# Patient Record
Sex: Male | Born: 1985 | Race: Black or African American | Hispanic: No | Marital: Single | Smoking: Never smoker
Health system: Southern US, Community
[De-identification: ages and names within clinical notes are randomized; demographics above are authoritative.]

---

## 2005-03-16 ENCOUNTER — Emergency Department (HOSPITAL_COMMUNITY): Admission: EM | Admit: 2005-03-16 | Discharge: 2005-03-17 | Payer: Self-pay | Admitting: Emergency Medicine

## 2005-09-10 ENCOUNTER — Encounter: Admission: RE | Admit: 2005-09-10 | Discharge: 2005-09-10 | Payer: Self-pay | Admitting: Family Medicine
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
## 2009-10-19 ENCOUNTER — Emergency Department (HOSPITAL_COMMUNITY): Admission: EM | Admit: 2009-10-19 | Discharge: 2009-10-19 | Payer: Self-pay | Admitting: Family Medicine

## 2009-11-07 ENCOUNTER — Emergency Department (HOSPITAL_COMMUNITY): Admission: EM | Admit: 2009-11-07 | Discharge: 2009-11-07 | Payer: Self-pay | Admitting: Family Medicine

## 2010-08-31 ENCOUNTER — Inpatient Hospital Stay (INDEPENDENT_AMBULATORY_CARE_PROVIDER_SITE_OTHER)
Admission: RE | Admit: 2010-08-31 | Discharge: 2010-08-31 | Disposition: A | Discharge status: Home / Self Care | Payer: BC Managed Care – PPO | Source: Ambulatory Visit | Attending: Family Medicine | Admitting: Family Medicine

## 2010-08-31 DIAGNOSIS — M549 Dorsalgia, unspecified: Secondary | ICD-10-CM

## 2010-09-21 ENCOUNTER — Inpatient Hospital Stay (INDEPENDENT_AMBULATORY_CARE_PROVIDER_SITE_OTHER)
Admission: RE | Admit: 2010-09-21 | Discharge: 2010-09-21 | Disposition: A | Discharge status: Home / Self Care | Payer: BC Managed Care – PPO | Source: Ambulatory Visit | Attending: Family Medicine | Admitting: Family Medicine

## 2010-09-21 DIAGNOSIS — IMO0002 Reserved for concepts with insufficient information to code with codable children: Secondary | ICD-10-CM

## 2013-09-06 ENCOUNTER — Emergency Department (INDEPENDENT_AMBULATORY_CARE_PROVIDER_SITE_OTHER)
Admission: EM | Admit: 2013-09-06 | Discharge: 2013-09-06 | Disposition: A | Discharge status: Home / Self Care | Payer: Self-pay | Source: Home / Self Care | Attending: Family Medicine | Admitting: Family Medicine

## 2013-09-06 ENCOUNTER — Encounter (HOSPITAL_COMMUNITY): Payer: Self-pay | Admitting: Emergency Medicine

## 2013-09-06 DIAGNOSIS — M545 Low back pain, unspecified: Secondary | ICD-10-CM

## 2013-09-06 MED ORDER — TRAMADOL HCL 50 MG PO TABS: 50.0000 mg | ORAL_TABLET | Freq: Four times a day (QID) | ORAL | Status: DC | PRN

## 2013-09-06 MED ORDER — DICLOFENAC SODIUM 75 MG PO TBEC
75.0000 mg | DELAYED_RELEASE_TABLET | Freq: Two times a day (BID) | ORAL | Status: DC | PRN
Start: 2013-09-06 — End: 2013-09-11

## 2013-09-06 MED ORDER — METHYLPREDNISOLONE ACETATE 80 MG/ML IJ SUSP
80.0000 mg | Freq: Once | INTRAMUSCULAR | Status: AC
Start: 2013-09-06 — End: 2013-09-06
  Administered 2013-09-06: 80 mg via INTRAMUSCULAR

## 2013-09-06 MED ORDER — CYCLOBENZAPRINE HCL 10 MG PO TABS: 10.0000 mg | ORAL_TABLET | Freq: Every evening | ORAL | Status: DC | PRN

## 2013-09-06 MED ORDER — METHYLPREDNISOLONE ACETATE 80 MG/ML IJ SUSP
INTRAMUSCULAR | Status: AC
  Filled 2013-09-06: qty 1

## 2013-09-06 MED ORDER — KETOROLAC TROMETHAMINE 60 MG/2ML IM SOLN
INTRAMUSCULAR | Status: AC
  Filled 2013-09-06: qty 2

## 2013-09-06 MED ORDER — KETOROLAC TROMETHAMINE 60 MG/2ML IM SOLN
60.0000 mg | Freq: Once | INTRAMUSCULAR | Status: AC
  Administered 2013-09-06: 60 mg via INTRAMUSCULAR

## 2013-09-06 NOTE — Discharge Instructions (Signed)
Thank you for coming in today. Take diclofenac twice daily started this evening. This medication we'll not make you sleepy.  Use Flexeril at bedtime as needed for muscle thousand. This medication will make you sleepy. Use tramadol for severe pain. Do not drive after taking this medication. Come back or go to the emergency room if you notice new weakness new numbness problems walking or bowel or bladder problems. Use a heating pad.  Back Exercises Back exercises help treat and prevent back injuries. The goal is to increase your strength in your belly (abdominal) and back muscles. These exercises can also help with flexibility. Start these exercises when told by your doctor. HOME CARE Back exercises include: Pelvic Tilt.  Lie on your back with your knees bent. Tilt your pelvis until the lower part of your back is against the floor. Hold this position 5 to 10 sec. Repeat this exercise 5 to 10 times. Knee to Chest.  Pull 1 knee up against your chest and hold for 20 to 30 seconds. Repeat this with the other knee. This may be done with the other leg straight or bent, whichever feels better. Then, pull both knees up against your chest. Sit-Ups or Curl-Ups.  Bend your knees 90 degrees. Start with tilting your pelvis, and do a partial, slow sit-up. Only lift your upper half 30 to 45 degrees off the floor. Take at least 2 to 3 seonds for each sit-up. Do not do sit-ups with your knees out straight. If partial sit-ups are difficult, simply do the above but with only tightening your belly (abdominal) muscles and holding it as told. Hip-Lift.  Lie on your back with your knees flexed 90 degrees. Push down with your feet and shoulders as you raise your hips 2 inches off the floor. Hold for 10 seconds, repeat 5 to 10 times. Back Arches.  Lie on your stomach. Prop yourself up on bent elbows. Slowly press on your hands, causing an arch in your low back. Repeat 3 to 5 times. Shoulder-Lifts.  Lie face down  with arms beside your body. Keep hips and belly pressed to floor as you slowly lift your head and shoulders off the floor. Do not overdo your exercises. Be careful in the beginning. Exercises may cause you some mild back discomfort. If the pain lasts for more than 15 minutes, stop the exercises until you see your doctor. Improvement with exercise for back problems is slow.  Document Released: 07/27/2010 Document Revised: 09/16/2011 Document Reviewed: 04/25/2011 [facility] Patient Information 2014 [place]Care, [place].

## 2013-09-06 NOTE — ED Provider Notes (Signed)
Jim PanningJerome Norris is a 28 y.o. male who presents to Urgent Care today for low back pain. Patient notes acute onset of low back pain starting today. He had shoveled snow the past few days and was changing a tire on his car today when he felt a pull in sensation in his low back. He has had moderate to severe low back pain since. He denies any radiating pain weakness numbness bowel bladder dysfunction or difficulty walking. He tried Tylenol which has not helped. He feels well otherwise. He denies any injury.   No past medical history on file. History  Substance Use Topics  . Smoking status: Not on file  . Smokeless tobacco: Not on file  . Alcohol Use: Not on file   ROS as above Medications: Current Facility-Administered Medications  Medication Dose Route Frequency Provider Last Rate Last Dose  . ketorolac (TORADOL) injection 60 mg  60 mg Intramuscular Once Rodolph BongEvan S Rigdon Macomber, MD      . methylPREDNISolone acetate (DEPO-MEDROL) injection 80 mg  80 mg Intramuscular Once Rodolph BongEvan S Saamir Armstrong, MD       Current Outpatient Prescriptions  Medication Sig Dispense Refill  . cyclobenzaprine (FLEXERIL) 10 MG tablet Take 1 tablet (10 mg total) by mouth at bedtime as needed for muscle spasms.  20 tablet  0  . diclofenac (VOLTAREN) 75 MG EC tablet Take 1 tablet (75 mg total) by mouth 2 (two) times daily as needed.  60 tablet  0  . traMADol (ULTRAM) 50 MG tablet Take 1 tablet (50 mg total) by mouth every 6 (six) hours as needed.  15 tablet  0    Exam:  BP 168/93  Pulse 89  Temp(Src) 98.5 F (36.9 C) (Oral)  Resp 16  SpO2 100% Gen: Well NAD Back: Nontender to spinal midline. Decreased flexion and extension due to pain. Tender palpation bilateral lumbar paraspinals. Strength is intact throughout. Reflexes are diminished but equal bilateral lower extremities. Normal gait. Sensation intact throughout.  Patient was given IM Toradol and Depo-Medrol prior to discharge.    Assessment and Plan: 28 y.o. male with  myofascial disruption resulting in lumbago.  Plan to treat with NSAIDs, muscle relaxers, heating pad, and tramadol.  Discussed warning signs or symptoms. Please see discharge instructions. Patient expresses understanding.    Rodolph BongEvan S Haniyyah Sakuma, MD 09/06/13 [phone]

## 2013-09-06 NOTE — ED Notes (Signed)
No known injury. Eval by MD only

## 2013-09-11 ENCOUNTER — Emergency Department (HOSPITAL_COMMUNITY)
Admission: EM | Admit: 2013-09-11 | Discharge: 2013-09-11 | Disposition: A | Discharge status: Home / Self Care | Payer: Self-pay | Attending: Emergency Medicine | Admitting: Emergency Medicine

## 2013-09-11 ENCOUNTER — Encounter (HOSPITAL_COMMUNITY): Payer: Self-pay | Admitting: Emergency Medicine

## 2013-09-11 DIAGNOSIS — R197 Diarrhea, unspecified: Secondary | ICD-10-CM | POA: Insufficient documentation

## 2013-09-11 DIAGNOSIS — IMO0002 Reserved for concepts with insufficient information to code with codable children: Secondary | ICD-10-CM | POA: Insufficient documentation

## 2013-09-11 DIAGNOSIS — F17200 Nicotine dependence, unspecified, uncomplicated: Secondary | ICD-10-CM | POA: Insufficient documentation

## 2013-09-11 DIAGNOSIS — R112 Nausea with vomiting, unspecified: Secondary | ICD-10-CM | POA: Insufficient documentation

## 2013-09-11 DIAGNOSIS — Y9289 Other specified places as the place of occurrence of the external cause: Secondary | ICD-10-CM | POA: Insufficient documentation

## 2013-09-11 DIAGNOSIS — X58XXXA Exposure to other specified factors, initial encounter: Secondary | ICD-10-CM | POA: Insufficient documentation

## 2013-09-11 DIAGNOSIS — Y93H1 Activity, digging, shoveling and raking: Secondary | ICD-10-CM | POA: Insufficient documentation

## 2013-09-11 DIAGNOSIS — M549 Dorsalgia, unspecified: Secondary | ICD-10-CM

## 2013-09-11 MED ORDER — ONDANSETRON 4 MG PO TBDP
8.0000 mg | ORAL_TABLET | Freq: Once | ORAL | Status: AC
  Administered 2013-09-11: 8 mg via ORAL
  Filled 2013-09-11: qty 2

## 2013-09-11 MED ORDER — IBUPROFEN 800 MG PO TABS: 800.0000 mg | ORAL_TABLET | Freq: Three times a day (TID) | ORAL | Status: DC

## 2013-09-11 MED ORDER — OXYCODONE-ACETAMINOPHEN 5-325 MG PO TABS: 1.0000 | ORAL_TABLET | ORAL | Status: DC | PRN

## 2013-09-11 MED ORDER — ONDANSETRON HCL 4 MG PO TABS: 4.0000 mg | ORAL_TABLET | Freq: Four times a day (QID) | ORAL | Status: DC

## 2013-09-11 MED ORDER — OXYCODONE-ACETAMINOPHEN 5-325 MG PO TABS
1.0000 | ORAL_TABLET | Freq: Once | ORAL | Status: AC
  Administered 2013-09-11: 1 via ORAL
  Filled 2013-09-11: qty 1

## 2013-09-11 NOTE — ED Notes (Signed)
Went to ucc for lower back pain. Prescribed flexeril and told to take ibuprofen. Pt's. Stomach became upset and stomach got worse. Tried taking med. With food.

## 2013-09-11 NOTE — ED Notes (Signed)
Pt. Desires some ginger ale while feeling at times nauseated. Told pt. Since he has been nauseated, he needs to stay NPO.

## 2013-09-11 NOTE — ED Provider Notes (Signed)
CSN: [id number]     Arrival date & time 09/11/13  0622 History   First MD Initiated Contact with Patient 09/11/13 [phone]     Chief Complaint  Patient presents with  . Back Pain     (Consider location/radiation/quality/duration/timing/severity/associated sxs/prior Treatment) Patient is a 28 y.o. male presenting with back pain. The history is provided by the patient. No language interpreter was used.  Back Pain Location:  Lumbar spine Associated symptoms: no fever   Associated symptoms comment:  Lower back pain after shoveling snow 2 days prior to onset of pain. No numbness, tingling, radiation of pain, or weakness of extremities. He was seen 5 days ago for same and prescribed Flexeril, Ultram and Diclofenac. He returns today with continued back pain, and now nausea and vomiting with limited diarrhea concerned that the medications caused his new symptoms. No fever. No urinary or bowel incontinence.    History reviewed. No pertinent past medical history. History reviewed. No pertinent past surgical history. History reviewed. No pertinent family history. History  Substance Use Topics  . Smoking status: Current Every Day Smoker  . Smokeless tobacco: Not on file  . Alcohol Use: Yes    Review of Systems  Constitutional: Negative for fever and chills.  HENT: Negative.   Respiratory: Negative.   Cardiovascular: Negative.   Gastrointestinal: Positive for nausea, vomiting and diarrhea.  Musculoskeletal: Positive for back pain.       See HPI  Skin: Negative.   Neurological: Negative.       Allergies  Review of patient's allergies indicates no known allergies.  Home Medications   Current Outpatient Rx  Name  Route  Sig  Dispense  Refill  . cyclobenzaprine (FLEXERIL) 10 MG tablet   Oral   Take 1 tablet (10 mg total) by mouth at bedtime as needed for muscle spasms.   20 tablet   0   . ibuprofen (ADVIL,MOTRIN) 200 MG tablet   Oral   Take 200 mg by mouth every 6 (six) hours as  needed for moderate pain.         . traMADol (ULTRAM) 50 MG tablet   Oral   Take 1 tablet (50 mg total) by mouth every 6 (six) hours as needed.   15 tablet   0    BP 144/95  Pulse 74  Temp(Src) 97.3 F (36.3 C) (Oral)  Resp 18  Ht 5\' 11"  (1.803 m)  Wt 245 lb (111.131 kg)  BMI 34.19 kg/m2  SpO2 99% Physical Exam  Constitutional: He is oriented to person, place, and time. He appears well-developed and well-nourished.  Neck: Normal range of motion.  Pulmonary/Chest: Effort normal.  Abdominal: Soft. He exhibits no distension and no mass. There is no tenderness. There is no rebound and no guarding.  Musculoskeletal: Normal range of motion.  Bilateral upper lumbar tenderness without swelling or palpable spasm.   Neurological: He is alert and oriented to person, place, and time.  He is ambulatory without difficulty.  Skin: Skin is warm and dry.  Psychiatric: He has a normal mood and affect.    ED Course  Procedures (including critical care time) Labs Review Labs Reviewed - No data to display Imaging Review No results found.   EKG Interpretation None      MDM   Final diagnoses:  None    1. Lower back pain 2. N, V  Zofran given for nausea. Tolerating soda and crackers in the room without vomiting. Will treat pain. As he has been taking  previous medications for 5 days without GI upset until yesterday, doubt GI symptoms are medication related and may be incidental low grade viral or even food bourne illness. Will treat symptomatically and encourage outpatient follow up at Urgent Care.    Arnoldo Hooker, PA-C 09/11/13 0730

## 2013-09-11 NOTE — Discharge Instructions (Signed)
Back Exercises °Back exercises help treat and prevent back injuries. The goal of back exercises is to increase the strength of your abdominal and back muscles and the flexibility of your back. These exercises should be started when you no longer have back pain. Back exercises include: °· Pelvic Tilt. Lie on your back with your knees bent. Tilt your pelvis until the lower part of your back is against the floor. Hold this position 5 to 10 sec and repeat 5 to 10 times. °· Knee to Chest. Pull first 1 knee up against your chest and hold for 20 to 30 seconds, repeat this with the other knee, and then both knees. This may be done with the other leg straight or bent, whichever feels better. °· Sit-Ups or Curl-Ups. Bend your knees 90 degrees. Start with tilting your pelvis, and do a partial, slow sit-up, lifting your trunk only 30 to 45 degrees off the floor. Take at least 2 to 3 seconds for each sit-up. Do not do sit-ups with your knees out straight. If partial sit-ups are difficult, simply do the above but with only tightening your abdominal muscles and holding it as directed. °· Hip-Lift. Lie on your back with your knees flexed 90 degrees. Push down with your feet and shoulders as you raise your hips a couple inches off the floor; hold for 10 seconds, repeat 5 to 10 times. °· Back arches. Lie on your stomach, propping yourself up on bent elbows. Slowly press on your hands, causing an arch in your low back. Repeat 3 to 5 times. Any initial stiffness and discomfort should lessen with repetition over time. °· Shoulder-Lifts. Lie face down with arms beside your body. Keep hips and torso pressed to floor as you slowly lift your head and shoulders off the floor. °Do not overdo your exercises, especially in the beginning. Exercises may cause you some mild back discomfort which lasts for a few minutes; however, if the pain is more severe, or lasts for more than 15 minutes, do not continue exercises until you see your caregiver.  Improvement with exercise therapy for back problems is slow.  °See your caregivers for assistance with developing a proper back exercise program. °Document Released: 08/01/2004 Document Revised: 09/16/2011 Document Reviewed: 04/25/2011 °Exit[employer]® Patient Information ©2014 [employer]. ° °Back Pain, Adult °Low back pain is very common. About 1 in 5 people have back pain. The cause of low back pain is rarely dangerous. The pain often gets better over time. About half of people with a sudden onset of back pain feel better in just 2 weeks. About 8 in 10 people feel better by 6 weeks.  °CAUSES °Some common causes of back pain include: °· Strain of the muscles or ligaments supporting the spine. °· Wear and tear (degeneration) of the spinal discs. °· Arthritis. °· Direct injury to the back. °DIAGNOSIS °Most of the time, the direct cause of low back pain is not known. However, back pain can be treated effectively even when the exact cause of the pain is unknown. Answering your caregiver's questions about your overall health and symptoms is one of the most accurate ways to make sure the cause of your pain is not dangerous. If your caregiver needs more information, he or she may order lab work or imaging tests (X-rays or MRIs). However, even if imaging tests show changes in your back, this usually does not require surgery. °HOME CARE INSTRUCTIONS °For many people, back pain returns. Since low back pain is rarely dangerous, it is often a condition that people   can learn to [facility] Incmanageon their own.   Remain active. It is stressful on the back to sit or stand in one place. Do not sit, drive, or stand in one place for more than 30 minutes at a time. Take short walks on level surfaces as soon as pain allows.Try to increase the length of time you walk each day.  Do not stay in bed.Resting more than 1 or 2 days can delay your recovery.  Do not avoid exercise or work.Your body is made to move.It is not dangerous to be active,  even though your back may hurt.Your back will likely heal faster if you return to being active before your pain is gone.  Pay attention to your body when you bend and lift. Many people have less discomfortwhen lifting if they bend their knees, keep the load close to their bodies,and avoid twisting. Often, the most comfortable positions are those that put less stress on your recovering back.  Find a comfortable position to sleep. Use a firm mattress and lie on your side with your knees slightly bent. If you lie on your back, put a pillow under your knees.  Only take over-the-counter or prescription medicines as directed by your caregiver. Over-the-counter medicines to reduce pain and inflammation are often the most helpful.Your caregiver may prescribe muscle relaxant drugs.These medicines help dull your pain so you can more quickly return to your normal activities and healthy exercise.  Put ice on the injured area.  Put ice in a plastic bag.  Place a towel between your skin and the bag.  Leave the ice on for 15-20 minutes, 03-04 times a day for the first 2 to 3 days. After that, ice and heat may be alternated to reduce pain and spasms.  Ask your caregiver about trying back exercises and gentle massage. This may be of some benefit.  Avoid feeling anxious or stressed.Stress increases muscle tension and can worsen back pain.It is important to recognize when you are anxious or stressed and learn ways to manage it.Exercise is a great option. SEEK MEDICAL CARE IF:  You have pain that is not relieved with rest or medicine.  You have pain that does not improve in 1 week.  You have new symptoms.  You are generally not feeling well. SEEK IMMEDIATE MEDICAL CARE IF:   You have pain that radiates from your back into your legs.  You develop new bowel or bladder control problems.  You have unusual weakness or numbness in your arms or legs.  You develop nausea or vomiting.  You develop  abdominal pain.  You feel faint. Document Released: 06/24/2005 Document Revised: 12/24/2011 Document Reviewed: 11/12/2010 [facility]yneExit[facility] Patient Information 2014 [place]Care, [place]LL[place]. Nausea and Vomiting Nausea is a sick feeling that often comes before throwing up (vomiting). Vomiting is a reflex where stomach contents come out of your mouth. Vomiting can cause severe loss of body fluids (dehydration). Children and elderly adults can become dehydrated quickly, especially if they also have diarrhea. Nausea and vomiting are symptoms of a condition or disease. It is important to find the cause of your symptoms. CAUSES   Direct irritation of the stomach lining. This irritation can result from increased acid production (gastroesophageal reflux disease), infection, food poisoning, taking certain medicines (such as nonsteroidal anti-inflammatory drugs), alcohol use, or tobacco use.  Signals from the brain.These signals could be caused by a headache, heat exposure, an inner ear disturbance, increased pressure in the brain from injury, infection, a tumor, or a concussion, pain, emotional  stimulus, or metabolic problems.  An obstruction in the gastrointestinal tract (bowel obstruction).  Illnesses such as diabetes, hepatitis, gallbladder problems, appendicitis, kidney problems, cancer, sepsis, atypical symptoms of a heart attack, or eating disorders.  Medical treatments such as chemotherapy and radiation.  Receiving medicine that makes you sleep (general anesthetic) during surgery. DIAGNOSIS Your caregiver may ask for tests to be done if the problems do not improve after a few days. Tests may also be done if symptoms are severe or if the reason for the nausea and vomiting is not clear. Tests may include:  Urine tests.  Blood tests.  Stool tests.  Cultures (to look for evidence of infection).  X-rays or other imaging studies. Test results can help your caregiver make decisions about treatment or the need  for additional tests. TREATMENT You need to stay well hydrated. Drink frequently but in small amounts.You may wish to drink water, sports drinks, clear broth, or eat frozen ice pops or gelatin dessert to help stay hydrated.When you eat, eating slowly may help prevent nausea.There are also some antinausea medicines that may help prevent nausea. HOME CARE INSTRUCTIONS   Take all medicine as directed by your caregiver.  If you do not have an appetite, do not force yourself to eat. However, you must continue to drink fluids.  If you have an appetite, eat a normal diet unless your caregiver tells you differently.  Eat a variety of complex carbohydrates (rice, wheat, potatoes, bread), lean meats, yogurt, fruits, and vegetables.  Avoid high-fat foods because they are more difficult to digest.  Drink enough water and fluids to keep your urine clear or pale yellow.  If you are dehydrated, ask your caregiver for specific rehydration instructions. Signs of dehydration may include:  Severe thirst.  Dry lips and mouth.  Dizziness.  Dark urine.  Decreasing urine frequency and amount.  Confusion.  Rapid breathing or pulse. SEEK IMMEDIATE MEDICAL CARE IF:   You have blood or brown flecks (like coffee grounds) in your vomit.  You have black or bloody stools.  You have a severe headache or stiff neck.  You are confused.  You have severe abdominal pain.  You have chest pain or trouble breathing.  You do not urinate at least once every 8 hours.  You develop cold or clammy skin.  You continue to vomit for longer than 24 to 48 hours.  You have a fever. MAKE SURE YOU:   Understand these instructions.  Will watch your condition.  Will get help right away if you are not doing well or get worse. Document Released: 06/24/2005 Document Revised: 09/16/2011 Document Reviewed: 11/21/2010 [facility] Patient Information 2014 [place], [place].

## 2013-09-11 NOTE — ED Provider Notes (Signed)
Medical screening examination/treatment/procedure(s) were performed by non-physician practitioner and as supervising physician I was immediately available for consultation/collaboration.     Olivia Mackielga M Forrest Demuro, MD 09/11/13 [phone]

## 2015-09-22 ENCOUNTER — Encounter (HOSPITAL_COMMUNITY): Payer: Self-pay

## 2015-09-22 ENCOUNTER — Emergency Department (HOSPITAL_COMMUNITY)
Admission: EM | Admit: 2015-09-22 | Discharge: 2015-09-22 | Disposition: A | Discharge status: Home / Self Care | Payer: Self-pay | Attending: Emergency Medicine | Admitting: Emergency Medicine

## 2015-09-22 DIAGNOSIS — Z791 Long term (current) use of non-steroidal anti-inflammatories (NSAID): Secondary | ICD-10-CM | POA: Insufficient documentation

## 2015-09-22 DIAGNOSIS — R197 Diarrhea, unspecified: Secondary | ICD-10-CM | POA: Insufficient documentation

## 2015-09-22 DIAGNOSIS — F17210 Nicotine dependence, cigarettes, uncomplicated: Secondary | ICD-10-CM | POA: Insufficient documentation

## 2015-09-22 DIAGNOSIS — J111 Influenza due to unidentified influenza virus with other respiratory manifestations: Secondary | ICD-10-CM | POA: Insufficient documentation

## 2015-09-22 DIAGNOSIS — M545 Low back pain: Secondary | ICD-10-CM | POA: Insufficient documentation

## 2015-09-22 DIAGNOSIS — Z79899 Other long term (current) drug therapy: Secondary | ICD-10-CM | POA: Insufficient documentation

## 2015-09-22 NOTE — ED Provider Notes (Signed)
CSN: [id number]     Arrival date & time 09/22/15  4098 History  By signing my name below, I, Jim Norris, attest that this documentation has been prepared under the direction and in the presence of Arthor Captain, PA-C Electronically Signed: Charline Bills, ED Scribe 09/22/2015 at 10:44 AM.   Chief Complaint  Patient presents with  . Fever   The history is provided by the patient. No language interpreter was used.   HPI Comments: Jim Norris is a 30 y.o. male who presents to the Emergency Department complaining of intermittent fever onst 3 days ago. Triage temperature 99.7 F. Pt reports associated symptoms of chills, night sweats, fatigue, mild cough, generalized body aches, low back pain, mild diarrhea. He has tried Tylenol and NyQuil without significant relief. He denies abdominal pain and any urinary symptoms.   History reviewed. No pertinent past medical history. History reviewed. No pertinent past surgical history. No family history on file. Social History  Substance Use Topics  . Smoking status: Current Every Day Smoker    Types: Cigarettes  . Smokeless tobacco: None  . Alcohol Use: Yes    Review of Systems  Constitutional: Positive for fever, chills, diaphoresis and fatigue.  Respiratory: Positive for cough.   Gastrointestinal: Positive for diarrhea. Negative for abdominal pain.  Genitourinary: Negative.   Musculoskeletal: Positive for myalgias and back pain.   Allergies  Review of patient's allergies indicates no known allergies.  Home Medications   Prior to Admission medications   Medication Sig Start Date End Date Taking? Authorizing Provider  cyclobenzaprine (FLEXERIL) 10 MG tablet Take 1 tablet (10 mg total) by mouth at bedtime as needed for muscle spasms. 09/06/13   Rodolph Bong, MD  ibuprofen (ADVIL,MOTRIN) 200 MG tablet Take 200 mg by mouth every 6 (six) hours as needed for moderate pain.    Historical Provider, MD  ibuprofen (ADVIL,MOTRIN) 800 MG tablet Take 1  tablet (800 mg total) by mouth 3 (three) times daily. 09/11/13   Elpidio Anis, PA-C  ondansetron (ZOFRAN) 4 MG tablet Take 1 tablet (4 mg total) by mouth every 6 (six) hours. 09/11/13   Elpidio Anis, PA-C  oxyCODONE-acetaminophen (PERCOCET/ROXICET) 5-325 MG per tablet Take 1 tablet by mouth every 4 (four) hours as needed for severe pain. 09/11/13   Elpidio Anis, PA-C  traMADol (ULTRAM) 50 MG tablet Take 1 tablet (50 mg total) by mouth every 6 (six) hours as needed. 09/06/13   Rodolph Bong, MD   BP 122/87 mmHg  Pulse 94  Temp(Src) 99.7 F (37.6 C) (Oral)  Resp 16  SpO2 100% Physical Exam  Constitutional: He is oriented to person, place, and time. He appears well-developed and well-nourished. No distress.  HENT:  Head: Normocephalic and atraumatic.  Eyes: Conjunctivae and EOM are normal.  Neck: Neck supple. No tracheal deviation present.  Cardiovascular: Normal rate.   Pulmonary/Chest: Effort normal. No respiratory distress. He has rhonchi.  A little rhonchi in chest with cough.   Musculoskeletal: Normal range of motion.  Lymphadenopathy:       Head (right side): Tonsillar adenopathy present.       Head (left side): Tonsillar adenopathy present.  Neurological: He is alert and oriented to person, place, and time.  Skin: Skin is warm and dry.  Psychiatric: He has a normal mood and affect. His behavior is normal.  Nursing note and vitals reviewed.  ED Course  Procedures (including critical care time) DIAGNOSTIC STUDIES: Oxygen Saturation is 100% on RA, normal by my interpretation.  COORDINATION OF CARE: 10:35 AM-Discussed treatment plan with pt at bedside and pt agreed to plan.   Labs Review Labs Reviewed - No data to display  Imaging Review No results found.   EKG Interpretation None      MDM   Final diagnoses:  Influenza    Patient with symptoms consistent with influenza.  Vitals are stable, low-grade fever.  No signs of dehydration, tolerating PO's.  Lungs are clear.  Due to patient's presentation and physical exam a chest x-ray was not ordered bc likely diagnosis of flu.  Discussed the cost versus benefit of Tamiflu treatment with the patient.  The patient understands that symptoms are greater than the recommended 24-48 hour window of treatment.  Patient will be discharged with instructions to orally hydrate, rest, and use over-the-counter medications such as anti-inflammatories ibuprofen and Aleve for muscle aches and Tylenol for fever.  Patient will also be given a cough suppressant.   I personally performed the services described in this documentation, which was scribed in my presence. The recorded information has been reviewed and is accurate.       Arthor Captainbigail Jozi Malachi, PA-C 09/22/15 1046  Glynn OctaveStephen Rancour, MD 09/22/15 [phone]

## 2015-09-22 NOTE — ED Notes (Addendum)
Generalized body aches, intermittent fever. Has been taking Nyquil and tylenol.  Pt. Reports having loose stool denies any chest pain or sob.  Having posterior neck stiffness and lower back pain,.  Skin is warm and dry.  Movement of the neck does not increase pain. Denies any sore throat or cough.  Did have nasal congestion yesterday.

## 2015-09-22 NOTE — Discharge Instructions (Signed)
Take 1 Dayquil liquicap every 4-6 hours. Take motrin 400 mg (2 tablets every 4-6 hours in between dayquil doses)  Drink plenty of fluids and rest as much as possible.   Influenza, Adult Influenza ("the flu") is a viral infection of the respiratory tract. It occurs more often in winter months because people spend more time in close contact with one another. Influenza can make you feel very sick. Influenza easily spreads from person to person (contagious). CAUSES  Influenza is caused by a virus that infects the respiratory tract. You can catch the virus by breathing in droplets from an infected person's cough or sneeze. You can also catch the virus by touching something that was recently contaminated with the virus and then touching your mouth, nose, or eyes. RISKS AND COMPLICATIONS You may be at risk for a more severe case of influenza if you smoke cigarettes, have diabetes, have chronic heart disease (such as heart failure) or lung disease (such as asthma), or if you have a weakened immune system. Elderly people and pregnant women are also at risk for more serious infections. The most common problem of influenza is a lung infection (pneumonia). Sometimes, this problem can require emergency medical care and may be life threatening. SIGNS AND SYMPTOMS  Symptoms typically last 4 to 10 days and may include:  Fever.  Chills.  Headache, body aches, and muscle aches.  Sore throat.  Chest discomfort and cough.  Poor appetite.  Weakness or feeling tired.  Dizziness.  Nausea or vomiting. DIAGNOSIS  Diagnosis of influenza is often made based on your history and a physical exam. A nose or throat swab test can be done to confirm the diagnosis. TREATMENT  In mild cases, influenza goes away on its own. Treatment is directed at relieving symptoms. For more severe cases, your health care provider may prescribe antiviral medicines to shorten the sickness. Antibiotic medicines are not effective because  the infection is caused by a virus, not by bacteria. HOME CARE INSTRUCTIONS  Take medicines only as directed by your health care provider.  Use a cool mist humidifier to make breathing easier.  Get plenty of rest until your temperature returns to normal. This usually takes 3 to 4 days.  Drink enough fluid to keep your urine clear or pale yellow.  Cover yourmouth and nosewhen coughing or sneezing,and wash your handswellto prevent thevirusfrom spreading.  Stay homefromwork orschool untilthe fever is gonefor at least 1101full day. PREVENTION  An annual influenza vaccination (flu shot) is the best way to avoid getting influenza. An annual flu shot is now routinely recommended for all adults in the [place] SEEK MEDICAL CARE IF:  You experiencechest pain, yourcough worsens,or you producemore mucus.  Youhave nausea,vomiting, ordiarrhea.  Your fever returns or gets worse. SEEK IMMEDIATE MEDICAL CARE IF:  You havetrouble breathing, you become short of breath,or your skin ornails becomebluish.  You have severe painor stiffnessin the neck.  You develop a sudden headache, or pain in the face or ear.  You have nausea or vomiting that you cannot control. MAKE SURE YOU:   Understand these instructions.  Will watch your condition.  Will get help right away if you are not doing well or get worse.   This information is not intended to replace advice given to you by your health care provider. Make sure you discuss any questions you have with your health care provider.   Document Released: 06/21/2000 Document Revised: 07/15/2014 Document Reviewed: 09/23/2011 Elsevier Interactive Patient Education [employer]2016 [employer] Inc[employer]

## 2015-10-12 ENCOUNTER — Emergency Department (HOSPITAL_COMMUNITY)
Admission: EM | Admit: 2015-10-12 | Discharge: 2015-10-12 | Disposition: A | Discharge status: Home / Self Care | Payer: Self-pay | Attending: Emergency Medicine | Admitting: Emergency Medicine

## 2015-10-12 ENCOUNTER — Encounter (HOSPITAL_COMMUNITY): Payer: Self-pay | Admitting: *Deleted

## 2015-10-12 DIAGNOSIS — M25512 Pain in left shoulder: Secondary | ICD-10-CM | POA: Insufficient documentation

## 2015-10-12 DIAGNOSIS — Z87828 Personal history of other (healed) physical injury and trauma: Secondary | ICD-10-CM | POA: Insufficient documentation

## 2015-10-12 DIAGNOSIS — Z79899 Other long term (current) drug therapy: Secondary | ICD-10-CM | POA: Insufficient documentation

## 2015-10-12 DIAGNOSIS — Z791 Long term (current) use of non-steroidal anti-inflammatories (NSAID): Secondary | ICD-10-CM | POA: Insufficient documentation

## 2015-10-12 MED ORDER — METHOCARBAMOL 500 MG PO TABS: 500.0000 mg | ORAL_TABLET | Freq: Two times a day (BID) | ORAL | Status: DC

## 2015-10-12 MED ORDER — IBUPROFEN 800 MG PO TABS: 800.0000 mg | ORAL_TABLET | Freq: Three times a day (TID) | ORAL | Status: DC

## 2015-10-12 NOTE — ED Provider Notes (Signed)
CSN: [id number]     Arrival date & time 10/12/15  1314 History  By signing my name below, I, Ronney LionSuzanne Le, attest that this documentation has been prepared under the direction and in the presence of Fayrene HelperBowie Jontavia Leatherbury, PA-C. Electronically Signed: Ronney LionSuzanne Le, ED Scribe. 10/12/2015. 3:05 PM.    Chief Complaint  Patient presents with  . Shoulder Pain    The history is provided by the patient. No language interpreter was used.    HPI Comments: Jim Norris is a 30 y.o. male who presents to the Emergency Department complaining of throbbing, 5/10 left shoulder pain that began 3 days ago. He denies any known injury, recent changes in activity, or heavy lifting, stating, "I just woke up with it hurting." He notes a history of shoulder dislocation 10 years ago. Patient states he is right-hand-dominant. He has applied IcyHot and taken Tylenol with moderate relief. He denies neck pain or stiffness, chest pain, trouble breathing, elbow or wrist pain, or numbness.   History reviewed. No pertinent past medical history. History reviewed. No pertinent past surgical history. No family history on file. Social History  Substance Use Topics  . Smoking status: Never Smoker   . Smokeless tobacco: None  . Alcohol Use: No    Review of Systems  Respiratory: Negative for shortness of breath.   Cardiovascular: Negative for chest pain.  Musculoskeletal: Positive for arthralgias (left shoulder pain). Negative for neck pain and neck stiffness.  Neurological: Negative for numbness.     Allergies  Review of patient's allergies indicates no known allergies.  Home Medications   Prior to Admission medications   Medication Sig Start Date End Date Taking? Authorizing Provider  cyclobenzaprine (FLEXERIL) 10 MG tablet Take 1 tablet (10 mg total) by mouth at bedtime as needed for muscle spasms. 09/06/13   Rodolph BongEvan S Corey, MD  ibuprofen (ADVIL,MOTRIN) 200 MG tablet Take 200 mg by mouth every 6 (six) hours as needed for moderate pain.     Historical Provider, MD  ibuprofen (ADVIL,MOTRIN) 800 MG tablet Take 1 tablet (800 mg total) by mouth 3 (three) times daily. 09/11/13   Elpidio AnisShari Upstill, PA-C  ondansetron (ZOFRAN) 4 MG tablet Take 1 tablet (4 mg total) by mouth every 6 (six) hours. 09/11/13   Elpidio AnisShari Upstill, PA-C  oxyCODONE-acetaminophen (PERCOCET/ROXICET) 5-325 MG per tablet Take 1 tablet by mouth every 4 (four) hours as needed for severe pain. 09/11/13   Elpidio AnisShari Upstill, PA-C  traMADol (ULTRAM) 50 MG tablet Take 1 tablet (50 mg total) by mouth every 6 (six) hours as needed. 09/06/13   Rodolph BongEvan S Corey, MD   BP 133/81 mmHg  Pulse 89  Temp(Src) 98.5 F (36.9 C) (Oral)  Resp 14  SpO2 100% Physical Exam  Constitutional: He is oriented to person, place, and time. He appears well-developed and well-nourished. No distress.  HENT:  Head: Normocephalic and atraumatic.  Eyes: Conjunctivae and EOM are normal.  Neck: Neck supple. No tracheal deviation present.  Cardiovascular: Normal rate.   Pulmonary/Chest: Effort normal. No respiratory distress.  Musculoskeletal: He exhibits tenderness.  Decreased shoulder raise, abduction, adduction on active ROM, secondary to pain. Normal passive ROM. Tenderness noted to the glenohumeral joint on palpation, without any overlying skin changes. Left clavicle is stable. No pain to left chest wall. Radial pulse is 2+. Normal grip strength. Normal skin sensation.   Neurological: He is alert and oriented to person, place, and time.  Skin: Skin is warm and dry.  Psychiatric: He has a normal mood and affect.  His behavior is normal.  Nursing note and vitals reviewed.   ED Course  Procedures (including critical care time)  DIAGNOSTIC STUDIES: Oxygen Saturation is 100% on RA, normal by my interpretation.    COORDINATION OF CARE: 3:05 PM - Doubt shoulder dislocation. No swelling to suggest dvt.  Low suspicion for ACS.  Suspect MSK.  Pt is NVI.   Discussed treatment plan with pt at bedside which includes Rx muscle  relaxants and anti-inflammatory medications. Will give orthopedic referral for follow-up. Pt verbalized understanding and agreed to plan.   MDM   Final diagnoses:  Acute shoulder pain, left    I personally performed the services described in this documentation, which was scribed in my presence. The recorded information has been reviewed and is accurate.       Fayrene Helper, PA-C 10/12/15 1523  Benjiman Core, MD 10/13/15 [phone]

## 2015-10-12 NOTE — ED Notes (Signed)
Pt states he is leaving to go get a jacket out of car.

## 2015-10-12 NOTE — ED Notes (Signed)
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

## 2015-10-12 NOTE — Discharge Instructions (Signed)

## 2015-10-12 NOTE — ED Notes (Signed)
Called patient, no answer 

## 2015-10-12 NOTE — ED Notes (Addendum)
C/o left shoulder pain x 2 days. States "I just woke up with it hurting". Hx of dislocation 10 years ago. Pt declined xray due to cost concerns.

## 2015-10-12 NOTE — ED Notes (Signed)
Xray tech reports pt refusing xray at this time.

## 2018-08-31 ENCOUNTER — Ambulatory Visit (HOSPITAL_COMMUNITY)
Admission: EM | Admit: 2018-08-31 | Discharge: 2018-08-31 | Disposition: A | Discharge status: Home / Self Care | Payer: Self-pay | Attending: Family Medicine | Admitting: Family Medicine

## 2018-08-31 ENCOUNTER — Encounter (HOSPITAL_COMMUNITY): Payer: Self-pay

## 2018-08-31 DIAGNOSIS — R6889 Other general symptoms and signs: Secondary | ICD-10-CM

## 2018-08-31 MED ORDER — ACETAMINOPHEN 325 MG PO TABS
ORAL_TABLET | ORAL | Status: AC
  Filled 2018-08-31: qty 2

## 2018-08-31 MED ORDER — OSELTAMIVIR PHOSPHATE 75 MG PO CAPS: 75.0000 mg | ORAL_CAPSULE | Freq: Two times a day (BID) | ORAL | 0 refills | Status: DC

## 2018-08-31 MED ORDER — ACETAMINOPHEN 325 MG PO TABS
650.0000 mg | ORAL_TABLET | Freq: Once | ORAL | Status: AC
  Administered 2018-08-31: 650 mg via ORAL

## 2018-08-31 NOTE — ED Provider Notes (Signed)
[facility]    CSN: [id number] Arrival date & time: 08/31/18  1115     History   Chief Complaint Chief Complaint  Patient presents with  . Fever    HPI Jim Norris is a 33 y.o. male.   33 year old male comes in for 1 day history of flu like symptoms. He has mild nasal congestion, throat irritation, body aches, chills, fever. Tmax 102, has not taken any antipyretics. Denies abdominal pain, nausea, vomiting, diarrhea. Denies recent travels. Never smoker. Positive flu contact. Home remedies without improvement.      History reviewed. No pertinent past medical history.  There are no active problems to display for this patient.   History reviewed. No pertinent surgical history.     Home Medications    Prior to Admission medications   Medication Sig Start Date End Date Taking? Authorizing Provider  cyclobenzaprine (FLEXERIL) 10 MG tablet Take 1 tablet (10 mg total) by mouth at bedtime as needed for muscle spasms. 09/06/13   Rodolph Bong, MD  ibuprofen (ADVIL,MOTRIN) 800 MG tablet Take 1 tablet (800 mg total) by mouth 3 (three) times daily. 10/12/15   Fayrene Helper, PA-C  methocarbamol (ROBAXIN) 500 MG tablet Take 1 tablet (500 mg total) by mouth 2 (two) times daily. 10/12/15   Fayrene Helper, PA-C  ondansetron (ZOFRAN) 4 MG tablet Take 1 tablet (4 mg total) by mouth every 6 (six) hours. 09/11/13   Elpidio Anis, PA-C  oseltamivir (TAMIFLU) 75 MG capsule Take 1 capsule (75 mg total) by mouth every 12 (twelve) hours. 08/31/18   Cathie Hoops, Amy V, PA-C  oxyCODONE-acetaminophen (PERCOCET/ROXICET) 5-325 MG per tablet Take 1 tablet by mouth every 4 (four) hours as needed for severe pain. 09/11/13   Elpidio Anis, PA-C  traMADol (ULTRAM) 50 MG tablet Take 1 tablet (50 mg total) by mouth every 6 (six) hours as needed. 09/06/13   Rodolph Bong, MD    Family History Family History  Problem Relation Age of Onset  . Healthy Mother     Social History Social History   Tobacco Use  . Smoking  status: Never Smoker  . Smokeless tobacco: Never Used  Substance Use Topics  . Alcohol use: No  . Drug use: No     Allergies   Patient has no known allergies.   Review of Systems Review of Systems  Reason unable to perform ROS: See HPI as above.     Physical Exam Triage Vital Signs ED Triage Vitals  Enc Vitals Group     BP 08/31/18 1245 110/89     Pulse Rate 08/31/18 1245 95     Resp 08/31/18 1245 20     Temp 08/31/18 1245 (!) 101.8 F (38.8 C)     Temp Source 08/31/18 1245 Oral     SpO2 08/31/18 1245 100 %     Weight --      Height --      Head Circumference --      Peak Flow --      Pain Score 08/31/18 1249 0     Pain Loc --      Pain Edu? --      Excl. in GC? --    No data found.  Updated Vital Signs BP 110/89 (BP Location: Right Arm)   Pulse 95   Temp (!) 101.8 F (38.8 C) (Oral)   Resp 20   SpO2 100%   Physical Exam Constitutional:      General: He is  not in acute distress.    Appearance: He is well-developed. He is not ill-appearing, toxic-appearing or diaphoretic.  HENT:     Head: Normocephalic and atraumatic.     Right Ear: Tympanic membrane, ear canal and external ear normal. Tympanic membrane is not erythematous or bulging.     Left Ear: Tympanic membrane, ear canal and external ear normal. Tympanic membrane is not erythematous or bulging.     Nose: Nose normal. No congestion or rhinorrhea.     Right Sinus: No maxillary sinus tenderness or frontal sinus tenderness.     Left Sinus: No maxillary sinus tenderness or frontal sinus tenderness.     Mouth/Throat:     Mouth: Mucous membranes are moist.     Pharynx: Oropharynx is clear. Uvula midline.  Eyes:     Conjunctiva/sclera: Conjunctivae normal.     Pupils: Pupils are equal, round, and reactive to light.  Neck:     Musculoskeletal: Normal range of motion and neck supple.  Cardiovascular:     Rate and Rhythm: Normal rate and regular rhythm.     Heart sounds: Normal heart sounds. No murmur.  No friction rub. No gallop.   Pulmonary:     Effort: Pulmonary effort is normal.     Breath sounds: Normal breath sounds. No decreased breath sounds, wheezing, rhonchi or rales.  Skin:    General: Skin is warm and dry.  Neurological:     Mental Status: He is alert and oriented to person, place, and time.  Psychiatric:        Behavior: Behavior normal.        Judgment: Judgment normal.      UC Treatments / Results  Labs (all labs ordered are listed, but only abnormal results are displayed) Labs Reviewed - No data to display  EKG None  Radiology No results found.  Procedures Procedures (including critical care time)  Medications Ordered in UC Medications  acetaminophen (TYLENOL) tablet 650 mg (650 mg Oral Given 08/31/18 1253)    Initial Impression / Assessment and Plan / UC Course  I have reviewed the triage vital signs and the nursing notes.  Pertinent labs & imaging results that were available during my care of the patient were reviewed by me and considered in my medical decision making (see chart for details).    Flu like symptoms within treatment range. Discussed tamiflu vs symptomatic treatment. Patient would like to start tamiflu. Other symptomatic treatment discussed. Push fluids. Return precautions given. Patient expresses understanding and agrees to plan.  Final Clinical Impressions(s) / UC Diagnoses   Final diagnoses:  Flu-like symptoms    ED Prescriptions    Medication Sig Dispense Auth. Provider   oseltamivir (TAMIFLU) 75 MG capsule Take 1 capsule (75 mg total) by mouth every 12 (twelve) hours. 10 capsule Threasa Alpha, [place] 08/31/18 1310

## 2018-08-31 NOTE — ED Triage Notes (Signed)
Pt presents with fever since last night.  Pt has taken elderberry syrup which has not helped the temperature.

## 2018-08-31 NOTE — Discharge Instructions (Addendum)
Start tamiflu as directed. You can use over the counter medicine such as flonase, zyrtec for nasal congestion/drainage. You can use over the counter nasal saline rinse such as neti pot for nasal congestion. Keep hydrated, your urine should be clear to pale yellow in color. Tylenol/motrin for fever and pain. Monitor for any worsening of symptoms, chest pain, shortness of breath, wheezing, swelling of the throat, follow up for reevaluation.  °

## 2018-12-08 ENCOUNTER — Ambulatory Visit: Payer: Self-pay | Admitting: Registered Nurse

## 2019-05-22 ENCOUNTER — Other Ambulatory Visit: Payer: Self-pay

## 2019-05-22 ENCOUNTER — Emergency Department (HOSPITAL_COMMUNITY)
Admission: EM | Admit: 2019-05-22 | Discharge: 2019-05-22 | Disposition: A | Discharge status: Home / Self Care | Payer: Self-pay | Attending: Emergency Medicine | Admitting: Emergency Medicine

## 2019-05-22 ENCOUNTER — Emergency Department (HOSPITAL_COMMUNITY): Payer: Self-pay

## 2019-05-22 ENCOUNTER — Encounter (HOSPITAL_COMMUNITY): Payer: Self-pay | Admitting: Emergency Medicine

## 2019-05-22 DIAGNOSIS — Z79899 Other long term (current) drug therapy: Secondary | ICD-10-CM | POA: Insufficient documentation

## 2019-05-22 DIAGNOSIS — M25561 Pain in right knee: Secondary | ICD-10-CM | POA: Insufficient documentation

## 2019-05-22 MED ORDER — HYDROCODONE-ACETAMINOPHEN 5-325 MG PO TABS: 1.0000 | ORAL_TABLET | Freq: Four times a day (QID) | ORAL | 0 refills | Status: DC | PRN

## 2019-05-22 MED ORDER — IBUPROFEN 200 MG PO TABS
600.0000 mg | ORAL_TABLET | Freq: Once | ORAL | Status: AC
  Administered 2019-05-22: 600 mg via ORAL
  Filled 2019-05-22: qty 1

## 2019-05-22 NOTE — Progress Notes (Signed)
Orthopedic Tech Progress Note Patient Details:  Jim Norris 07-15-1985 [id number]  Ortho Devices Type of Ortho Device: Ace wrap, Crutches, Knee Immobilizer Ortho Device/Splint Location: right Ortho Device/Splint Interventions: Application   Post Interventions Patient Tolerated: Well Instructions Provided: Care of device   Jim Norris 05/22/2019, 5:56 PM

## 2019-05-22 NOTE — ED Triage Notes (Signed)
Pt c/o knee pain after cleaning his car last week and then again today. As he bent down in a squatting position, he heard a pop. Pt states he has had stiffness before and has had the "popping" before as well. However, this time the pain followed the pop. Pt reports he can bear a little weight on right leg, but with pain. Pt denies hx of injury to knee. Pt denies headache, N/V/D, abd pain, shob, back pain, and chest pain.

## 2019-05-22 NOTE — ED Provider Notes (Signed)
[facility]   CSN: [id number] Arrival date & time: 05/22/19  1605     History   Chief Complaint Chief Complaint  Patient presents with  . Knee Pain    HPI Junie PanningJerome Kisner is a 33 y.o. male with no significant past medical history who presents today for evaluation of right knee pain.  He reports that last week he had bent down to clean his car and when he got up he felt a pop.  He reports that his knee felt slightly stiff however he was able to work that stiffness out.  He states that today he was bending down and felt something pop inside his leg with pain after.  He reports he is unable to bear full weight on the leg secondary to pain.  He denies any history of injury to this knee.  He denies any other injuries.  He states that he is unable to fully straighten his leg at this time.  He denies any numbness or tingling.  No back pain.  Of note patient states that he is driving home and would not be able to get a ride.     HPI  No past medical history on file.  There are no active problems to display for this patient.   No past surgical history on file.      Home Medications    Prior to Admission medications   Medication Sig Start Date End Date Taking? Authorizing Provider  cyclobenzaprine (FLEXERIL) 10 MG tablet Take 1 tablet (10 mg total) by mouth at bedtime as needed for muscle spasms. 09/06/13   Rodolph Bongorey, Evan S, MD  HYDROcodone-acetaminophen (NORCO/VICODIN) 5-325 MG tablet Take 1 tablet by mouth every 6 (six) hours as needed for severe pain. 05/22/19   Cristina GongHammond, Emmalynn Pinkham W, PA-C  ibuprofen (ADVIL,MOTRIN) 800 MG tablet Take 1 tablet (800 mg total) by mouth 3 (three) times daily. 10/12/15   Fayrene Helperran, Bowie, PA-C  methocarbamol (ROBAXIN) 500 MG tablet Take 1 tablet (500 mg total) by mouth 2 (two) times daily. 10/12/15   Fayrene Helperran, Bowie, PA-C  ondansetron (ZOFRAN) 4 MG tablet Take 1 tablet (4 mg total) by mouth every 6 (six) hours. 09/11/13    Elpidio AnisUpstill, Shari, PA-C  oseltamivir (TAMIFLU) 75 MG capsule Take 1 capsule (75 mg total) by mouth every 12 (twelve) hours. 08/31/18   Cathie HoopsYu, Amy V, PA-C  oxyCODONE-acetaminophen (PERCOCET/ROXICET) 5-325 MG per tablet Take 1 tablet by mouth every 4 (four) hours as needed for severe pain. 09/11/13   Elpidio AnisUpstill, Shari, PA-C  traMADol (ULTRAM) 50 MG tablet Take 1 tablet (50 mg total) by mouth every 6 (six) hours as needed. 09/06/13   Rodolph Bongorey, Evan S, MD    Family History Family History  Problem Relation Age of Onset  . Healthy Mother     Social History Social History   Tobacco Use  . Smoking status: Never Smoker  . Smokeless tobacco: Never Used  Substance Use Topics  . Alcohol use: No  . Drug use: No     Allergies   Patient has no known allergies.   Review of Systems Review of Systems  Constitutional: Negative for chills and fever.  Musculoskeletal:       Right knee pain  All other systems reviewed and are negative.    Physical Exam Updated Vital Signs BP 124/73   Pulse 72   Temp 98.2 F (36.8 C) (Oral)   Resp 16   Ht 5\' 11"  (1.803 m)  Wt 113.4 kg   SpO2 100%   BMI 34.87 kg/m   Physical Exam Vitals signs and nursing note reviewed.  Constitutional:      Comments: Appears uncomfortable.  HENT:     Head: Normocephalic and atraumatic.  Cardiovascular:     Comments: 2+ DP/PT pulses bilaterally. Musculoskeletal:     Comments: Right knee is held flexed at 90 degrees.  He is unable to straighten it secondary to pain.  Quadriceps tendon and patella tendon appear grossly intact.  No tenderness to palpation in the right ankle or thigh.  Compartments in the right leg are soft and easily compressible. Right knee is tender to palpation over the medial aspect.  No obvious crepitus or deformity.  Right knee is grossly stable to anterior/posterior stress and valgus/varus stress.  Skin:    Comments: There is no abnormal wounds, lacerations, erythema, ecchymosis or skin breaks over the right  knee.  Neurological:     Mental Status: He is alert.     Comments: Incision intact to light touch to right lower extremity.    On repeat exam patient has been able to straighten his knee.  He has been able to get up and go to the bathroom however reports a significant limp.  He reports his pain is better however his knee still does not feel normal.  He is able to sit up and appears comfortable.  ED Treatments / Results  Labs (all labs ordered are listed, but only abnormal results are displayed) Labs Reviewed - No data to display  EKG None  Radiology Dg Knee Right Port  Result Date: 05/22/2019 CLINICAL DATA:  Knee pain EXAM: PORTABLE RIGHT KNEE - 1-2 VIEW COMPARISON:  None. FINDINGS: No fracture or malalignment. Joint spaces are maintained. No significant knee effusion IMPRESSION: No acute osseous abnormality Electronically Signed   By: Donavan Foil M.D.   On: 05/22/2019 17:15    Procedures Procedures (including critical care time)  Medications Ordered in ED Medications  ibuprofen (ADVIL) tablet 600 mg (has no administration in time range)     Initial Impression / Assessment and Plan / ED Course  I have reviewed the triage vital signs and the nursing notes.  Pertinent labs & imaging results that were available during my care of the patient were reviewed by me and considered in my medical decision making (see chart for details).       Quincy Simmonds Presents with right knee pain after bending down and feeling a pop consistent with a knee sprain/strain.  The affected knee is tender on the medial  aspect.  X-rays were obtained with out acute abnormality. The skin is intact to ankle/foot.  The foot is warm and well perfused with intact sensation.  Motor function is limited secondary to pain.  Patient given instructions for OTC pain medication, Ace wrap, knee immobilizer, crutches and 4 pills of Vicodin with instructions to take these only as needed for severe breakthrough pain  patient advised to follow up with orthopedics.  As this is patient's right knee I did recommend that he do not drive and discussed possible risks of doing this.  Return precautions were discussed with patient who states their understanding.  At the time of discharge patient denied any unaddressed complaints or concerns.  Patient is agreeable for discharge home.   Final Clinical Impressions(s) / ED Diagnoses   Final diagnoses:  Acute pain of right knee    ED Discharge Orders         Ordered  HYDROcodone-acetaminophen (NORCO/VICODIN) 5-325 MG tablet  Every 6 hours PRN     05/22/19 1747           Cristina Gong, PA-C 05/22/19 1749    Rolan Bucco, MD 05/22/19 [phone]

## 2019-05-22 NOTE — Discharge Instructions (Addendum)
I would recommend that you do not drive given that it is your right knee that is causing pain.  Delayed reaction times can put you and others at risk of a crash that may have otherwise been prevented.  Please take Ibuprofen (Advil, motrin) and Tylenol (acetaminophen) to relieve your pain.  You may take up to 600 MG (3 pills) of normal strength ibuprofen every 8 hours as needed.  In between doses of ibuprofen you make take tylenol, up to 1,000 mg (two extra strength pills).  Do not take more than 3,000 mg tylenol in a 24 hour period.  Please check all medication labels as many medications such as pain and cold medications may contain tylenol.  Do not drink alcohol while taking these medications.  Do not take other NSAID'S while taking ibuprofen (such as aleve or naproxen).  Please take ibuprofen with food to decrease stomach upset.  You are being prescribed a medication which may make you sleepy. For 24 hours after one dose please do not drive, operate heavy machinery, care for a small child with out another adult present, or perform any activities that may cause harm to you or someone else if you were to fall asleep or be impaired.

## 2020-02-03 ENCOUNTER — Encounter (HOSPITAL_BASED_OUTPATIENT_CLINIC_OR_DEPARTMENT_OTHER): Payer: Self-pay | Admitting: Emergency Medicine

## 2020-02-03 ENCOUNTER — Emergency Department (HOSPITAL_BASED_OUTPATIENT_CLINIC_OR_DEPARTMENT_OTHER)
Admission: EM | Admit: 2020-02-03 | Discharge: 2020-02-03 | Disposition: A | Discharge status: Home / Self Care | Payer: Self-pay | Attending: Emergency Medicine | Admitting: Emergency Medicine

## 2020-02-03 ENCOUNTER — Other Ambulatory Visit: Payer: Self-pay

## 2020-02-03 DIAGNOSIS — L03115 Cellulitis of right lower limb: Secondary | ICD-10-CM | POA: Insufficient documentation

## 2020-02-03 DIAGNOSIS — L0390 Cellulitis, unspecified: Secondary | ICD-10-CM

## 2020-02-03 DIAGNOSIS — Z79899 Other long term (current) drug therapy: Secondary | ICD-10-CM | POA: Insufficient documentation

## 2020-02-03 MED ORDER — CLINDAMYCIN HCL 300 MG PO CAPS: 300.0000 mg | ORAL_CAPSULE | Freq: Three times a day (TID) | ORAL | 0 refills | Status: AC

## 2020-02-03 NOTE — Discharge Instructions (Addendum)
Take antibiotic as prescribed, use bacitracin/Neosporin ointment on your wound twice a day.

## 2020-02-03 NOTE — ED Triage Notes (Signed)
Pt reports spider bite to right leg, states noticed a hole in the wound today. Denies fevers.

## 2020-02-03 NOTE — ED Provider Notes (Signed)
[facility] EMERGENCY DEPARTMENT Provider Note   CSN: [id number] Arrival date & time: 02/03/20  2042     History No chief complaint on file.   Jim Norris is a 34 y.o. male.  The history is provided by the patient.  Rash Location: right thigh. Quality: redness   Severity:  Mild Onset quality:  Gradual Timing:  Constant Progression:  Unchanged Chronicity:  New Context comment:  Thinks maybe bug bite to right thigh Relieved by:  Nothing Worsened by:  Nothing Associated symptoms: no fever and no myalgias        History reviewed. No pertinent past medical history.  There are no problems to display for this patient.   History reviewed. No pertinent surgical history.     Family History  Problem Relation Age of Onset  . Healthy Mother     Social History   Tobacco Use  . Smoking status: Never Smoker  . Smokeless tobacco: Never Used  Vaping Use  . Vaping Use: Never used  Substance Use Topics  . Alcohol use: No  . Drug use: No    Home Medications Prior to Admission medications   Medication Sig Start Date End Date Taking? Authorizing Provider  clindamycin (CLEOCIN) 300 MG capsule Take 1 capsule (300 mg total) by mouth 3 (three) times daily for 10 days. 02/03/20 02/13/20  Kale Dols, DO  ibuprofen (ADVIL,MOTRIN) 800 MG tablet Take 1 tablet (800 mg total) by mouth 3 (three) times daily. 10/12/15   Fayrene Helper, PA-C  oseltamivir (TAMIFLU) 75 MG capsule Take 1 capsule (75 mg total) by mouth every 12 (twelve) hours. 08/31/18   Belinda Fisher, PA-C    Allergies    Patient has no known allergies.  Review of Systems   Review of Systems  Constitutional: Negative for fever.  Musculoskeletal: Negative for myalgias.  Skin: Positive for color change, rash and wound. Negative for pallor.    Physical Exam Updated Vital Signs BP 114/76 (BP Location: Right Arm)   Pulse 98   Temp 98.7 F (37.1 C) (Oral)   Resp 18   Ht 5\' 10"  (1.778 m)   Wt (!) 111.3 kg    SpO2 97%   BMI 35.20 kg/m   Physical Exam Constitutional:      General: He is not in acute distress.    Appearance: He is not ill-appearing.  Cardiovascular:     Pulses: Normal pulses.  Musculoskeletal:        General: No tenderness. Normal range of motion.  Skin:    General: Skin is warm.     Capillary Refill: Capillary refill takes less than 2 seconds.     Comments: Patient with cellulitic/folliculitis to right thigh, no fluctuance or drainage, there is skin breakdown where possibly there was a small abscess  Neurological:     Mental Status: He is alert.     ED Results / Procedures / Treatments   Labs (all labs ordered are listed, but only abnormal results are displayed) Labs Reviewed - No data to display  EKG None  Radiology No results found.  Procedures Procedures (including critical care time)  Medications Ordered in ED Medications - No data to display  ED Course  I have reviewed the triage vital signs and the nursing notes.  Pertinent labs & imaging results that were available during my care of the patient were reviewed by me and considered in my medical decision making (see chart for details).    MDM Rules/Calculators/A&P  Jim Norris is a 34 year old male who presents to the ED with right thigh wound. Normal vitals. No fever. Area on the right thigh suspect folliculitis/cellulitis. It appears that there might have been some abscess that has already drained. There is no crepitus. No concern for deep space infection. Recommend Neosporin/bacitracin to the actual site and will prescribe clindamycin. Given wound care instructions and discharged from ED in good condition. Understands return precautions.  This chart was dictated using voice recognition software.  Despite best efforts to proofread,  errors can occur which can change the documentation meaning.   Final Clinical Impression(s) / ED Diagnoses Final diagnoses:  Cellulitis,  unspecified cellulitis site    Rx / DC Orders ED Discharge Orders         Ordered    clindamycin (CLEOCIN) 300 MG capsule  3 times daily     Discontinue  Reprint     02/03/20 2241           Virgina Norfolk, DO 02/03/20 2245

## 2020-09-05 IMAGING — DX DG KNEE 1-2V PORT*R*
2 series · 2 of 2 positions shown · non-contrast
Comparison: None.

CLINICAL DATA: Knee pain

EXAM:
PORTABLE RIGHT KNEE - 1-2 VIEW

[knee ap]
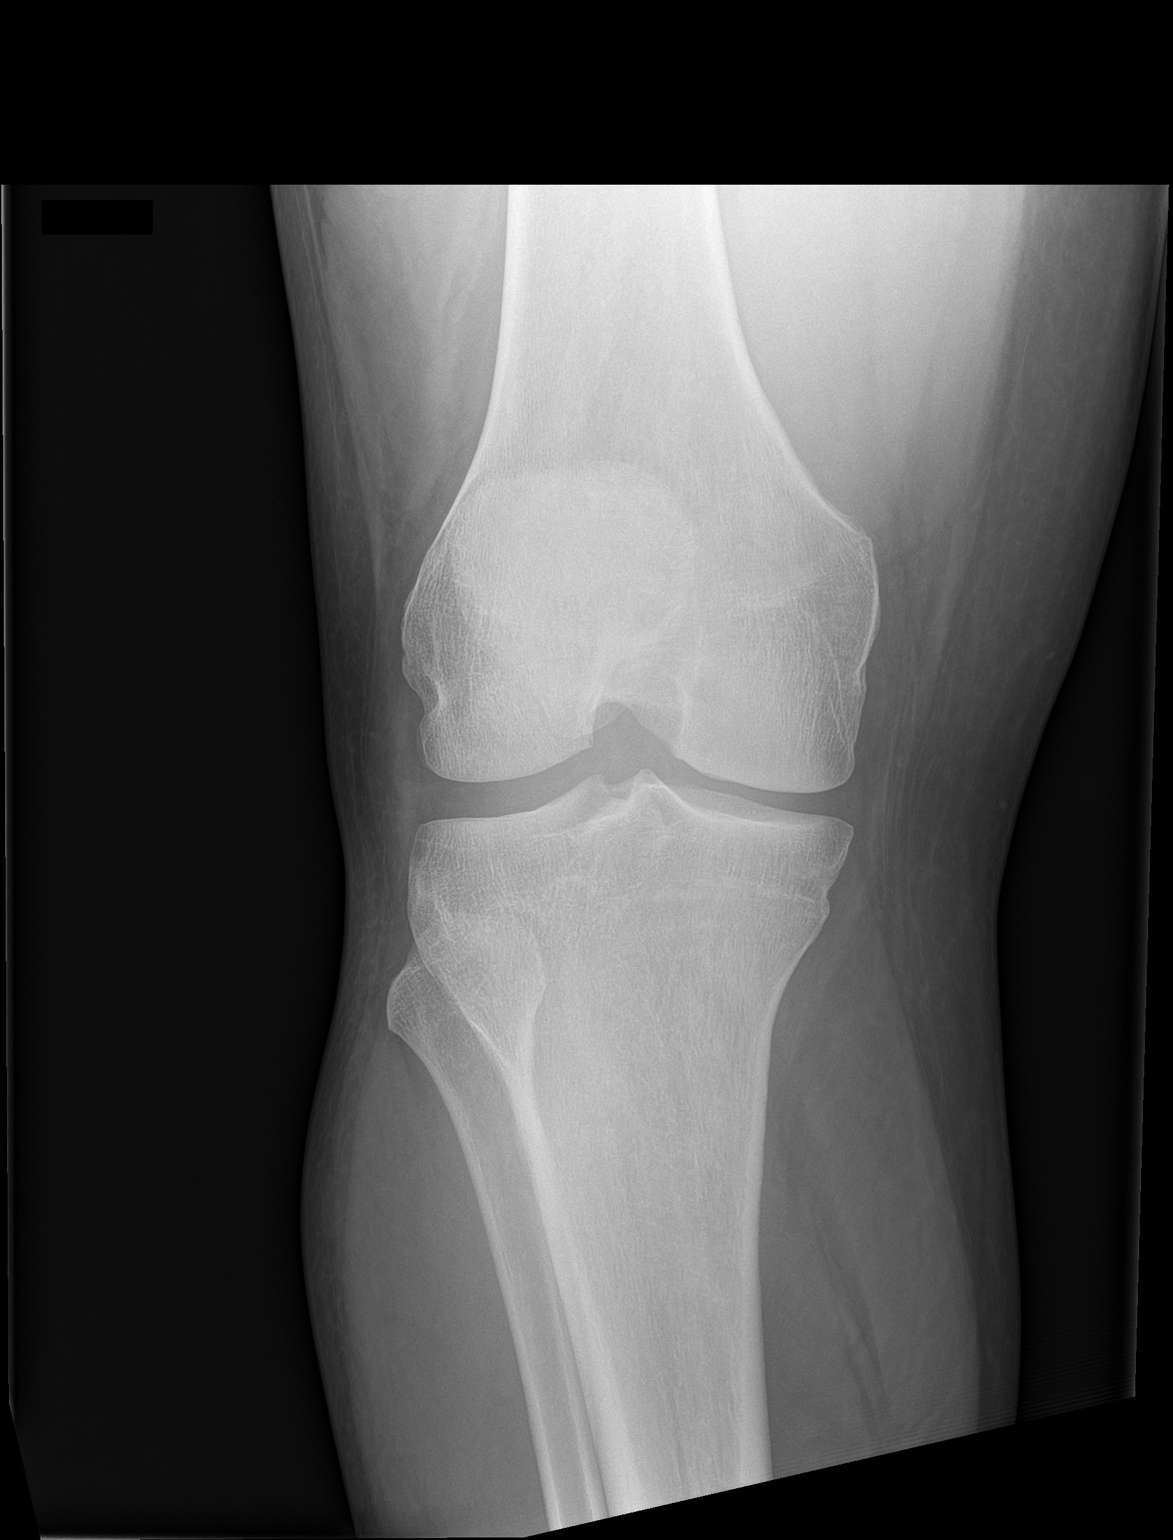

[knee lat]
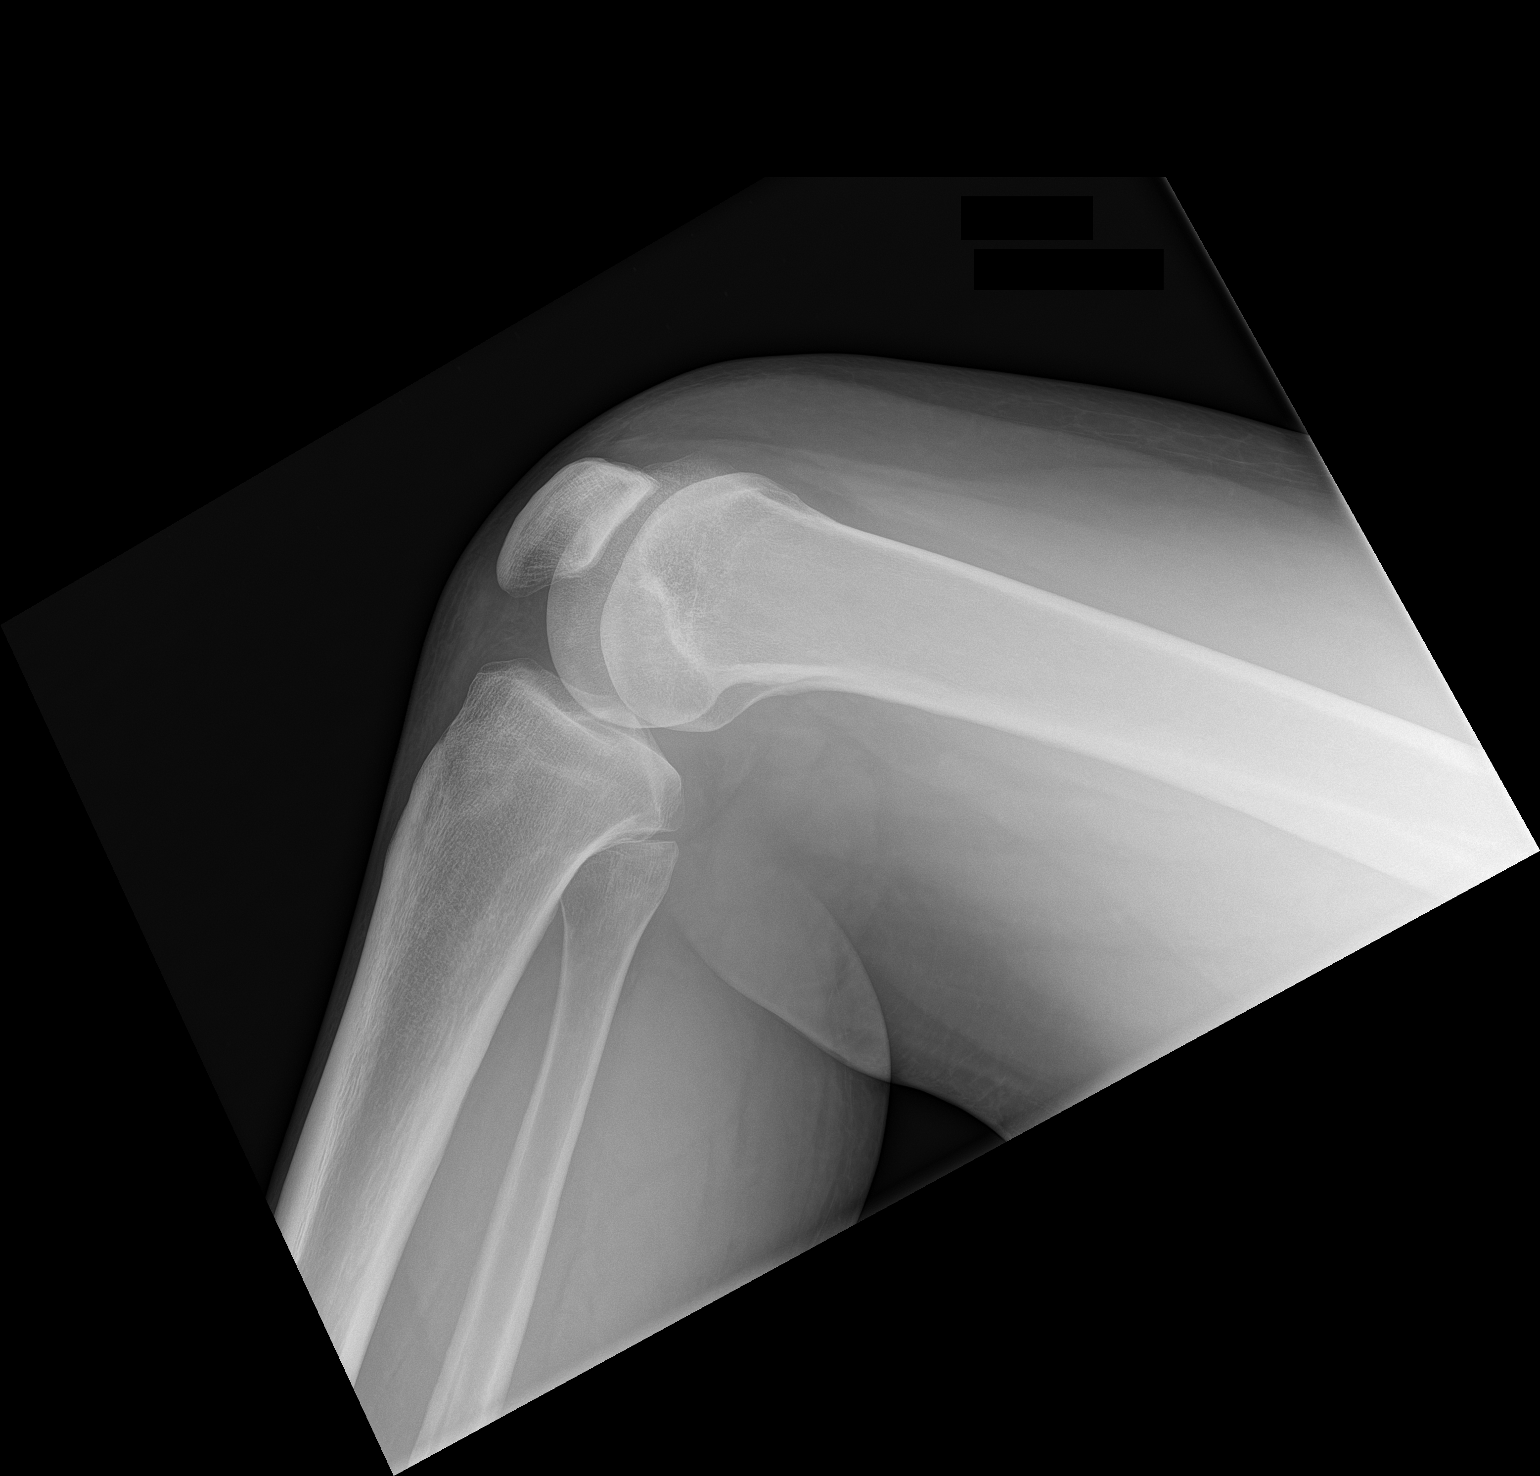

[2 of 2 positions shown; findings below may reference images not displayed]

FINDINGS: No fracture or malalignment. Joint spaces are maintained. No
significant knee effusion
IMPRESSION: No acute osseous abnormality

## 2021-03-14 ENCOUNTER — Encounter: Payer: Self-pay | Admitting: Family Medicine

## 2021-03-14 ENCOUNTER — Other Ambulatory Visit: Payer: Self-pay

## 2021-03-14 ENCOUNTER — Ambulatory Visit (INDEPENDENT_AMBULATORY_CARE_PROVIDER_SITE_OTHER): Payer: Self-pay | Admitting: Family Medicine

## 2021-03-14 VITALS — BP 125/77 | HR 66 | Temp 97.3°F | Resp 16 | Ht 72.5 in | Wt 256.2 lb

## 2021-03-14 DIAGNOSIS — M545 Low back pain, unspecified: Secondary | ICD-10-CM

## 2021-03-14 DIAGNOSIS — Z7689 Persons encountering health services in other specified circumstances: Secondary | ICD-10-CM

## 2021-03-14 NOTE — Progress Notes (Signed)
   New Patient Office Visit  Subjective:  Patient ID: Jim Norris, male    DOB: Feb 14, 1986  Age: 35 y.o. MRN: [id number]  CC:  Chief Complaint  Patient presents with   Establish Care   Back Pain    HPI Jim Norris presents for to establish care. Patient complains of intermittent back pain. Patient has not been taking meds for sx. Denies known trauma or injury.   History reviewed. No pertinent past medical history.  History reviewed. No pertinent surgical history.  Family History  Problem Relation Age of Onset   Healthy Mother    Diabetes Paternal Grandmother     Social History   Socioeconomic History   Marital status: Single    Spouse name: Not on file   Number of children: Not on file   Years of education: Not on file   Highest education level: Not on file  Occupational History   Not on file  Tobacco Use   Smoking status: Never   Smokeless tobacco: Never  Vaping Use   Vaping Use: Never used  Substance and Sexual Activity   Alcohol use: No   Drug use: No   Sexual activity: Not on file  Other Topics Concern   Not on file  Social History Narrative   Not on file   Social Determinants of Health   Financial Resource Strain: Not on file  Food Insecurity: Not on file  Transportation Needs: Not on file  Physical Activity: Not on file  Stress: Not on file  Social Connections: Not on file  Intimate Partner Violence: Not on file    ROS Review of Systems  Musculoskeletal:  Positive for back pain.  All other systems reviewed and are negative.  Objective:   Today's Vitals: BP 125/77 (BP Location: Right Arm, Patient Position: Sitting, Cuff Size: Large)   Pulse 66   Temp (!) 97.3 F (36.3 C) (Temporal)   Resp 16   Ht 6' 0.5" (1.842 m)   Wt 256 lb 3.2 oz (116.2 kg)   SpO2 97%   BMI 34.27 kg/m   Physical Exam Vitals and nursing note reviewed.  Constitutional:      General: He is not in acute distress. Cardiovascular:     Rate and Rhythm: Normal rate  and regular rhythm.  Pulmonary:     Effort: Pulmonary effort is normal.     Breath sounds: Normal breath sounds.  Abdominal:     Palpations: Abdomen is soft.     Tenderness: There is no abdominal tenderness.  Neurological:     General: No focal deficit present.     Mental Status: He is alert and oriented to person, place, and time.    Assessment & Plan:   1. Bilateral low back pain without sciatica, unspecified chronicity Not symptomatic at this time.  Patient given exercises.  Patient to utilize Tylenol/ibuprofen/topical preps as needed.  Patient defers physical therapy at this time secondary to insurance concerns.  2. Encounter to establish care    Outpatient Encounter Medications as of 03/14/2021  Medication Sig   ibuprofen (ADVIL,MOTRIN) 800 MG tablet Take 1 tablet (800 mg total) by mouth 3 (three) times daily. (Patient not taking: Reported on 03/14/2021)   oseltamivir (TAMIFLU) 75 MG capsule Take 1 capsule (75 mg total) by mouth every 12 (twelve) hours. (Patient not taking: Reported on 03/14/2021)   No facility-administered encounter medications on file as of 03/14/2021.    Follow-up: Return for physical.   Tommie Raymond, MD

## 2021-03-14 NOTE — Progress Notes (Signed)
Patient is here to est care with provider. Patient would like to discuss having pain in his back off and on a couple of months. Patient would like to know if there is anything he can do to prevent.

## 2022-11-20 ENCOUNTER — Ambulatory Visit (HOSPITAL_COMMUNITY)
Admission: EM | Admit: 2022-11-20 | Discharge: 2022-11-20 | Disposition: A | Discharge status: Home / Self Care | Payer: Medicaid Other | Attending: Internal Medicine | Admitting: Internal Medicine

## 2022-11-20 ENCOUNTER — Encounter (HOSPITAL_COMMUNITY): Payer: Self-pay

## 2022-11-20 DIAGNOSIS — H10023 Other mucopurulent conjunctivitis, bilateral: Secondary | ICD-10-CM

## 2022-11-20 MED ORDER — GENTAMICIN SULFATE 0.3 % OP SOLN: 2.0000 [drp] | OPHTHALMIC | 0 refills | Status: AC

## 2022-11-20 MED ORDER — OLOPATADINE HCL 0.1 % OP SOLN: 1.0000 [drp] | Freq: Two times a day (BID) | OPHTHALMIC | 12 refills | Status: DC

## 2022-11-20 NOTE — Discharge Instructions (Signed)
You have bacterial conjunctivitis (pink eye) which is an eye infection.    - Use antibiotic eye medication sent to pharmacy as directed.  - Change your pillowcase after 2 to 3 days to avoid reinfection.  - You may take Tylenol every 6 hours as needed for any pain you may have.  - Avoid scratching your eye.  Wash your hands frequently to avoid spread of infection to others.  Perform warm compresses to your eye before applying the eye medication. - Olopatadine eye drops as needed for eye itching.  If you develop any new or worsening symptoms or do not improve in the next 2 to 3 days, please return.  If your symptoms are severe, please go to the emergency room.  Follow-up with your primary care provider for further evaluation and management of your symptoms as well as ongoing wellness visits.  I hope you feel better!

## 2022-11-20 NOTE — ED Provider Notes (Signed)
[facility]    CSN: [id number] Arrival date & time: 11/20/22  1023      History   Chief Complaint No chief complaint on file.   HPI Jim Norris is a 37 y.o. male.   Patient presents to urgent care for evaluation of eye redness, itching, and crusting that started yesterday morning to one eye, then spread to the other eye this morning. Symptoms have worsened significantly in the last 24 hours. No blurry vision, denies contact lens/glasses use for vision correction, and no headaches/dizziness. Denies viral URI symptoms and history of seasonal allergies. Denies pain to the eyes, more irritation. Drainage from bilateral eyes is white and both eyes became more red this morning on waking. No recent known sick contacts with similar symptoms. No recent trauma/injuries to the eyes or known foreign bodies. Has not attempted use of any OTC medications to help.      History reviewed. No pertinent past medical history.  There are no problems to display for this patient.   History reviewed. No pertinent surgical history.     Home Medications    Prior to Admission medications   Medication Sig Start Date End Date Taking? Authorizing Provider  gentamicin (GARAMYCIN) 0.3 % ophthalmic solution Place 2 drops into both eyes every 4 (four) hours for 5 days. 11/20/22 11/25/22 Yes Amyr Sluder, Donavan Burnet, FNP  olopatadine (PATANOL) 0.1 % ophthalmic solution Place 1 drop into both eyes 2 (two) times daily. 11/20/22  Yes Carlisle Beers, FNP  ibuprofen (ADVIL,MOTRIN) 800 MG tablet Take 1 tablet (800 mg total) by mouth 3 (three) times daily. Patient not taking: Reported on 03/14/2021 10/12/15   Fayrene Helper, PA-C  oseltamivir (TAMIFLU) 75 MG capsule Take 1 capsule (75 mg total) by mouth every 12 (twelve) hours. Patient not taking: Reported on 03/14/2021 08/31/18   Lurline Idol    Family History Family History  Problem Relation Age of Onset   Healthy Mother    Diabetes Paternal Grandmother      Social History Social History   Tobacco Use   Smoking status: Never   Smokeless tobacco: Never  Vaping Use   Vaping Use: Never used  Substance Use Topics   Alcohol use: No   Drug use: No     Allergies   Patient has no known allergies.   Review of Systems Review of Systems Per HPI  Physical Exam Triage Vital Signs ED Triage Vitals  Enc Vitals Group     BP 11/20/22 1034 119/81     Pulse Rate 11/20/22 1034 89     Resp 11/20/22 1034 16     Temp 11/20/22 1034 98.3 F (36.8 C)     Temp Source 11/20/22 1034 Oral     SpO2 11/20/22 1034 96 %     Weight --      Height --      Head Circumference --      Peak Flow --      Pain Score 11/20/22 1036 5     Pain Loc --      Pain Edu? --      Excl. in GC? --    No data found.  Updated Vital Signs BP 119/81 (BP Location: Right Arm)   Pulse 89   Temp 98.3 F (36.8 C) (Oral)   Resp 16   SpO2 96%   Visual Acuity Right Eye Distance:   Left Eye Distance:   Bilateral Distance:    Right Eye Near:   Left  Eye Near:    Bilateral Near:     Physical Exam Vitals and nursing note reviewed.  Constitutional:      Appearance: He is not ill-appearing or toxic-appearing.  HENT:     Head: Normocephalic and atraumatic.     Right Ear: Hearing, tympanic membrane, ear canal and external ear normal.     Left Ear: Hearing, tympanic membrane, ear canal and external ear normal.     Nose: Nose normal.     Mouth/Throat:     Lips: Pink.     Mouth: Mucous membranes are moist. No injury.     Tongue: No lesions. Tongue does not deviate from midline.     Palate: No mass and lesions.     Pharynx: Oropharynx is clear. Uvula midline. No pharyngeal swelling, oropharyngeal exudate, posterior oropharyngeal erythema or uvula swelling.     Tonsils: No tonsillar exudate or tonsillar abscesses.  Eyes:     General: Lids are normal. Vision grossly intact. Gaze aligned appropriately.        Right eye: Discharge present.        Left eye: Discharge  present.    Extraocular Movements: Extraocular movements intact.     Conjunctiva/sclera:     Right eye: Right conjunctiva is injected.     Left eye: Left conjunctiva is injected.     Pupils: Pupils are equal, round, and reactive to light.     Comments: EOMs intact without pain or dizziness elicited.   Cardiovascular:     Rate and Rhythm: Normal rate and regular rhythm.     Heart sounds: Normal heart sounds, S1 normal and S2 normal.  Pulmonary:     Effort: Pulmonary effort is normal. No respiratory distress.     Breath sounds: Normal breath sounds and air entry.  Musculoskeletal:     Cervical back: Neck supple.  Skin:    General: Skin is warm and dry.     Capillary Refill: Capillary refill takes less than 2 seconds.     Findings: No rash.  Neurological:     General: No focal deficit present.     Mental Status: He is alert and oriented to person, place, and time. Mental status is at baseline.     Cranial Nerves: No dysarthria or facial asymmetry.  Psychiatric:        Mood and Affect: Mood normal.        Speech: Speech normal.        Behavior: Behavior normal.        Thought Content: Thought content normal.        Judgment: Judgment normal.      UC Treatments / Results  Labs (all labs ordered are listed, but only abnormal results are displayed) Labs Reviewed - No data to display  EKG   Radiology No results found.  Procedures Procedures (including critical care time)  Medications Ordered in UC Medications - No data to display  Initial Impression / Assessment and Plan / UC Course  I have reviewed the triage vital signs and the nursing notes.  Pertinent labs & imaging results that were available during my care of the patient were reviewed by me and considered in my medical decision making (see chart for details).   1. Mucopurulent conjunctivitis of both eyes Antibiotic medication to eyes as prescribed. Advised patient to change pillowcase after 2 to 3 days of  antibiotics to avoid reinfection.  Tylenol may be used every 6 hours as needed for pain and discomfort.  Advised  patient to avoid scratching the eye and wash hands frequently.  Warm compresses to be used prior to administration of eyedrops to reduce inflammation and encourage drainage of infected material from the eye.  Follow-up with urgent care as needed if symptoms do not improve in the next 24 to 48 hours with antibiotic eyedrops or if patient develops blurry vision/worsening visual acuity.     Discussed physical exam and available lab work findings in clinic with patient.  Counseled patient regarding appropriate use of medications and potential side effects for all medications recommended or prescribed today. Discussed red flag signs and symptoms of worsening condition,when to call the PCP office, return to urgent care, and when to seek higher level of care in the emergency department. Patient verbalizes understanding and agreement with plan. All questions answered. Patient discharged in stable condition.    Final Clinical Impressions(s) / UC Diagnoses   Final diagnoses:  Mucopurulent conjunctivitis of both eyes     Discharge Instructions      You have bacterial conjunctivitis (pink eye) which is an eye infection.    - Use antibiotic eye medication sent to pharmacy as directed.  - Change your pillowcase after 2 to 3 days to avoid reinfection.  - You may take Tylenol every 6 hours as needed for any pain you may have.  - Avoid scratching your eye.  Wash your hands frequently to avoid spread of infection to others.  Perform warm compresses to your eye before applying the eye medication. - Olopatadine eye drops as needed for eye itching.  If you develop any new or worsening symptoms or do not improve in the next 2 to 3 days, please return.  If your symptoms are severe, please go to the emergency room.  Follow-up with your primary care provider for further evaluation and management of your  symptoms as well as ongoing wellness visits.  I hope you feel better!     ED Prescriptions     Medication Sig Dispense Auth. Provider   gentamicin (GARAMYCIN) 0.3 % ophthalmic solution Place 2 drops into both eyes every 4 (four) hours for 5 days. 5 mL Reita May M, FNP   olopatadine (PATANOL) 0.1 % ophthalmic solution Place 1 drop into both eyes 2 (two) times daily. 5 mL Carlisle Beers, FNP      PDMP not reviewed this encounter.   Carlisle Beers, [place] 11/20/22 1110

## 2022-11-20 NOTE — ED Triage Notes (Signed)
Patient c/o bilateral eye irritation, "crust" on his eyes, and redness since yesterday.  Patient denies using any medication for his symptoms.

## 2023-08-23 ENCOUNTER — Emergency Department (HOSPITAL_COMMUNITY): Payer: No Typology Code available for payment source

## 2023-08-23 ENCOUNTER — Other Ambulatory Visit: Payer: Self-pay

## 2023-08-23 ENCOUNTER — Emergency Department (HOSPITAL_COMMUNITY)
Admission: EM | Admit: 2023-08-23 | Discharge: 2023-08-23 | Disposition: A | Discharge status: Home / Self Care | Payer: No Typology Code available for payment source | Attending: Emergency Medicine | Admitting: Emergency Medicine

## 2023-08-23 ENCOUNTER — Encounter (HOSPITAL_COMMUNITY): Payer: Self-pay

## 2023-08-23 DIAGNOSIS — Y92410 Unspecified street and highway as the place of occurrence of the external cause: Secondary | ICD-10-CM | POA: Insufficient documentation

## 2023-08-23 DIAGNOSIS — S40012A Contusion of left shoulder, initial encounter: Secondary | ICD-10-CM | POA: Insufficient documentation

## 2023-08-23 DIAGNOSIS — M25512 Pain in left shoulder: Secondary | ICD-10-CM | POA: Diagnosis present

## 2023-08-23 DIAGNOSIS — S39012A Strain of muscle, fascia and tendon of lower back, initial encounter: Secondary | ICD-10-CM | POA: Diagnosis not present

## 2023-08-23 LAB — COMPREHENSIVE METABOLIC PANEL
ALT: 30 U/L (ref 0–44)
AST: 38 U/L (ref 15–41)
Albumin: 4.3 g/dL (ref 3.5–5.0)
Alkaline Phosphatase: 53 U/L (ref 38–126)
Anion gap: 11 (ref 5–15)
BUN: 9 mg/dL (ref 6–20)
CO2: 22 mmol/L (ref 22–32)
Calcium: 9.3 mg/dL (ref 8.9–10.3)
Chloride: 106 mmol/L (ref 98–111)
Creatinine, Ser: 1.55 mg/dL — ABNORMAL HIGH (ref 0.61–1.24)
GFR, Estimated: 58 mL/min — ABNORMAL LOW (ref >60)
Glucose, Bld: 95 mg/dL (ref 70–99)
Potassium: 4.2 mmol/L (ref 3.5–5.1)
Sodium: 139 mmol/L (ref 135–145)
Total Bilirubin: 0.9 mg/dL (ref 0.0–1.2)
Total Protein: 7.4 g/dL (ref 6.5–8.1)

## 2023-08-23 LAB — CBC
HCT: 42.5 % (ref 39.0–52.0)
Hemoglobin: 14.6 g/dL (ref 13.0–17.0)
MCH: 29.9 pg (ref 26.0–34.0)
MCHC: 34.4 g/dL (ref 30.0–36.0)
MCV: 86.9 fL (ref 80.0–100.0)
Platelets: 211 10*3/uL (ref 150–400)
RBC: 4.89 MIL/uL (ref 4.22–5.81)
RDW: 13.8 % (ref 11.5–15.5)
WBC: 5.1 10*3/uL (ref 4.0–10.5)
nRBC: 0 % (ref 0.0–0.2)

## 2023-08-23 MED ORDER — IBUPROFEN 600 MG PO TABS: 600.0000 mg | ORAL_TABLET | Freq: Four times a day (QID) | ORAL | 0 refills | Status: AC | PRN

## 2023-08-23 MED ORDER — METHOCARBAMOL 500 MG PO TABS: 500.0000 mg | ORAL_TABLET | Freq: Two times a day (BID) | ORAL | 0 refills | Status: DC | PRN

## 2023-08-23 MED ORDER — IBUPROFEN 800 MG PO TABS
800.0000 mg | ORAL_TABLET | Freq: Once | ORAL | Status: AC
  Administered 2023-08-23: 800 mg via ORAL
  Filled 2023-08-23 (×2): qty 1

## 2023-08-23 MED ORDER — IOHEXOL 350 MG/ML SOLN
75.0000 mL | Freq: Once | INTRAVENOUS | Status: AC | PRN
  Administered 2023-08-23: 75 mL via INTRAVENOUS
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
## 2023-08-23 NOTE — Discharge Instructions (Signed)
 All of your x-rays are normal, there is no broken bones or internal injuries, ibuprofen for pain, Robaxin if you feel like you need a muscle relaxer.  Do not drive while taking this medication.  Please take Robaxin, 500 mg up to 2 or 3 times a day as needed for muscle spasm, this is a muscle relaxer, it may cause generalized weakness, sleepiness and you should not drive or do important things while taking this medication.  This includes driving a vehicle or taking care of young children, these things should not be done while taking this medication.   Thank you for allowing [place] to treat you in the emergency department today.  After reviewing your examination and potential testing that was done it appears that you are safe to go home.  I would like for you to follow-up with your doctor within the next several days, have them obtain your records and follow-up with them to review all potential tests and results from your visit.  If you should develop severe or worsening symptoms return to the emergency department immediately
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
## 2023-08-28 ENCOUNTER — Ambulatory Visit (HOSPITAL_COMMUNITY)
Admission: EM | Admit: 2023-08-28 | Discharge: 2023-08-28 | Disposition: A | Discharge status: Home / Self Care | Payer: Medicaid Other | Attending: Family Medicine | Admitting: Family Medicine

## 2023-08-28 ENCOUNTER — Encounter (HOSPITAL_COMMUNITY): Payer: Self-pay | Admitting: *Deleted

## 2023-08-28 DIAGNOSIS — S46002A Unspecified injury of muscle(s) and tendon(s) of the rotator cuff of left shoulder, initial encounter: Secondary | ICD-10-CM | POA: Diagnosis not present

## 2023-08-28 DIAGNOSIS — M25512 Pain in left shoulder: Secondary | ICD-10-CM

## 2023-08-28 DIAGNOSIS — S39012D Strain of muscle, fascia and tendon of lower back, subsequent encounter: Secondary | ICD-10-CM
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
## 2023-08-28 NOTE — Discharge Instructions (Addendum)
 You have a lower back strain. Apply ice or heat, whichever is more helpful. Apply lidocaine patches over the area for up to 12 hours at a time.  You can also place this over the shoulder. Avoid any lifting of weight but maintain your mobility Start light hamstring stretches Start Tylenol 500 mg, 1-2 tablets 3 times a day for the next 4-5 days. You can take naproxen 500 mg in between these times (twice daily) The limited portable ultrasound showed signs that are indicating a rotator cuff tear. Follow-up with the sports medicine clinic for diagnostic ultrasound of the left shoulder and possible injection therapy You can continue the Robaxin at night for sleep if it helps.
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
## 2023-09-01 ENCOUNTER — Encounter: Payer: Self-pay | Admitting: Family Medicine

## 2023-09-01 ENCOUNTER — Other Ambulatory Visit: Payer: Self-pay

## 2023-09-01 ENCOUNTER — Ambulatory Visit (INDEPENDENT_AMBULATORY_CARE_PROVIDER_SITE_OTHER): Payer: Medicaid Other | Admitting: Family Medicine

## 2023-09-01 VITALS — BP 124/80 | Ht 71.0 in | Wt 260.0 lb

## 2023-09-01 DIAGNOSIS — S40012A Contusion of left shoulder, initial encounter: Secondary | ICD-10-CM

## 2023-09-01 DIAGNOSIS — M25512 Pain in left shoulder: Secondary | ICD-10-CM | POA: Diagnosis present

## 2023-09-01 DIAGNOSIS — S39012A Strain of muscle, fascia and tendon of lower back, initial encounter: Secondary | ICD-10-CM

## 2023-09-01 MED ORDER — PREDNISONE 10 MG PO TABS: ORAL_TABLET | ORAL | 0 refills | Status: AC
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
## 2023-09-08 ENCOUNTER — Ambulatory Visit (HOSPITAL_BASED_OUTPATIENT_CLINIC_OR_DEPARTMENT_OTHER): Payer: Medicaid Other | Attending: Family Medicine | Admitting: Physical Therapy

## 2023-09-08 ENCOUNTER — Other Ambulatory Visit: Payer: Self-pay

## 2023-09-08 ENCOUNTER — Encounter (HOSPITAL_BASED_OUTPATIENT_CLINIC_OR_DEPARTMENT_OTHER): Payer: Self-pay | Admitting: Physical Therapy

## 2023-09-08 DIAGNOSIS — S39012D Strain of muscle, fascia and tendon of lower back, subsequent encounter: Secondary | ICD-10-CM | POA: Diagnosis present

## 2023-09-08 DIAGNOSIS — M542 Cervicalgia: Secondary | ICD-10-CM | POA: Insufficient documentation

## 2023-09-08 DIAGNOSIS — S40012D Contusion of left shoulder, subsequent encounter: Secondary | ICD-10-CM | POA: Diagnosis present

## 2023-09-08 DIAGNOSIS — M545 Low back pain, unspecified: Secondary | ICD-10-CM

## 2023-09-08 DIAGNOSIS — M25512 Pain in left shoulder: Secondary | ICD-10-CM | POA: Diagnosis not present
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
## 2023-09-15 ENCOUNTER — Ambulatory Visit (HOSPITAL_BASED_OUTPATIENT_CLINIC_OR_DEPARTMENT_OTHER): Admitting: Physical Therapy

## 2023-09-15 ENCOUNTER — Encounter (HOSPITAL_BASED_OUTPATIENT_CLINIC_OR_DEPARTMENT_OTHER): Payer: Self-pay | Admitting: Physical Therapy

## 2023-09-15 DIAGNOSIS — M542 Cervicalgia: Secondary | ICD-10-CM

## 2023-09-15 DIAGNOSIS — S40012D Contusion of left shoulder, subsequent encounter: Secondary | ICD-10-CM | POA: Diagnosis not present

## 2023-09-15 DIAGNOSIS — M25512 Pain in left shoulder: Secondary | ICD-10-CM

## 2023-09-15 DIAGNOSIS — M545 Low back pain, unspecified: Secondary | ICD-10-CM
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
## 2023-09-17 ENCOUNTER — Ambulatory Visit (HOSPITAL_BASED_OUTPATIENT_CLINIC_OR_DEPARTMENT_OTHER): Admitting: Physical Therapy

## 2023-09-17 ENCOUNTER — Encounter (HOSPITAL_BASED_OUTPATIENT_CLINIC_OR_DEPARTMENT_OTHER): Payer: Self-pay | Admitting: Physical Therapy

## 2023-09-17 DIAGNOSIS — S40012D Contusion of left shoulder, subsequent encounter: Secondary | ICD-10-CM | POA: Diagnosis not present

## 2023-09-17 DIAGNOSIS — M545 Low back pain, unspecified: Secondary | ICD-10-CM

## 2023-09-17 DIAGNOSIS — M542 Cervicalgia: Secondary | ICD-10-CM

## 2023-09-17 DIAGNOSIS — M25512 Pain in left shoulder: Secondary | ICD-10-CM
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
## 2023-09-24 ENCOUNTER — Encounter (HOSPITAL_BASED_OUTPATIENT_CLINIC_OR_DEPARTMENT_OTHER): Payer: Self-pay | Admitting: Physical Therapy

## 2023-09-24 ENCOUNTER — Ambulatory Visit (HOSPITAL_BASED_OUTPATIENT_CLINIC_OR_DEPARTMENT_OTHER): Admitting: Physical Therapy

## 2023-09-24 DIAGNOSIS — M25512 Pain in left shoulder: Secondary | ICD-10-CM

## 2023-09-24 DIAGNOSIS — M542 Cervicalgia: Secondary | ICD-10-CM

## 2023-09-24 DIAGNOSIS — S40012D Contusion of left shoulder, subsequent encounter: Secondary | ICD-10-CM | POA: Diagnosis not present

## 2023-09-24 DIAGNOSIS — M545 Low back pain, unspecified: Secondary | ICD-10-CM
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
## 2023-09-26 ENCOUNTER — Ambulatory Visit (HOSPITAL_BASED_OUTPATIENT_CLINIC_OR_DEPARTMENT_OTHER): Admitting: Physical Therapy

## 2023-09-26 ENCOUNTER — Encounter (HOSPITAL_BASED_OUTPATIENT_CLINIC_OR_DEPARTMENT_OTHER): Payer: Self-pay | Admitting: Physical Therapy

## 2023-09-26 DIAGNOSIS — M545 Low back pain, unspecified: Secondary | ICD-10-CM

## 2023-09-26 DIAGNOSIS — S40012D Contusion of left shoulder, subsequent encounter: Secondary | ICD-10-CM | POA: Diagnosis not present

## 2023-09-26 DIAGNOSIS — M542 Cervicalgia: Secondary | ICD-10-CM

## 2023-09-26 DIAGNOSIS — M25512 Pain in left shoulder: Secondary | ICD-10-CM
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
## 2023-09-29 ENCOUNTER — Encounter (HOSPITAL_BASED_OUTPATIENT_CLINIC_OR_DEPARTMENT_OTHER): Payer: Self-pay | Admitting: Physical Therapy

## 2023-09-29 ENCOUNTER — Ambulatory Visit (HOSPITAL_BASED_OUTPATIENT_CLINIC_OR_DEPARTMENT_OTHER): Admitting: Physical Therapy

## 2023-09-29 DIAGNOSIS — M542 Cervicalgia: Secondary | ICD-10-CM

## 2023-09-29 DIAGNOSIS — S40012D Contusion of left shoulder, subsequent encounter: Secondary | ICD-10-CM | POA: Diagnosis not present

## 2023-09-29 DIAGNOSIS — M25512 Pain in left shoulder: Secondary | ICD-10-CM

## 2023-09-29 DIAGNOSIS — M545 Low back pain, unspecified: Secondary | ICD-10-CM
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
## 2023-10-01 ENCOUNTER — Ambulatory Visit (HOSPITAL_BASED_OUTPATIENT_CLINIC_OR_DEPARTMENT_OTHER): Admitting: Physical Therapy

## 2023-10-01 ENCOUNTER — Encounter (HOSPITAL_BASED_OUTPATIENT_CLINIC_OR_DEPARTMENT_OTHER): Payer: Self-pay | Admitting: Physical Therapy

## 2023-10-01 DIAGNOSIS — M25512 Pain in left shoulder: Secondary | ICD-10-CM

## 2023-10-01 DIAGNOSIS — M545 Low back pain, unspecified: Secondary | ICD-10-CM

## 2023-10-01 DIAGNOSIS — M542 Cervicalgia: Secondary | ICD-10-CM

## 2023-10-01 DIAGNOSIS — S40012D Contusion of left shoulder, subsequent encounter: Secondary | ICD-10-CM | POA: Diagnosis not present
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
## 2023-10-06 ENCOUNTER — Encounter (HOSPITAL_BASED_OUTPATIENT_CLINIC_OR_DEPARTMENT_OTHER): Payer: Self-pay | Admitting: Physical Therapy

## 2023-10-06 ENCOUNTER — Ambulatory Visit (HOSPITAL_BASED_OUTPATIENT_CLINIC_OR_DEPARTMENT_OTHER): Admitting: Physical Therapy

## 2023-10-06 DIAGNOSIS — S40012D Contusion of left shoulder, subsequent encounter: Secondary | ICD-10-CM | POA: Diagnosis not present

## 2023-10-06 DIAGNOSIS — M545 Low back pain, unspecified: Secondary | ICD-10-CM

## 2023-10-06 DIAGNOSIS — M542 Cervicalgia: Secondary | ICD-10-CM

## 2023-10-06 DIAGNOSIS — M25512 Pain in left shoulder: Secondary | ICD-10-CM
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
## 2023-10-08 ENCOUNTER — Encounter (HOSPITAL_BASED_OUTPATIENT_CLINIC_OR_DEPARTMENT_OTHER): Payer: Self-pay | Admitting: Physical Therapy

## 2023-10-08 ENCOUNTER — Ambulatory Visit (HOSPITAL_BASED_OUTPATIENT_CLINIC_OR_DEPARTMENT_OTHER): Attending: Family Medicine | Admitting: Physical Therapy

## 2023-10-08 DIAGNOSIS — M25512 Pain in left shoulder: Secondary | ICD-10-CM | POA: Insufficient documentation

## 2023-10-08 DIAGNOSIS — M542 Cervicalgia: Secondary | ICD-10-CM | POA: Insufficient documentation

## 2023-10-08 DIAGNOSIS — M545 Low back pain, unspecified: Secondary | ICD-10-CM | POA: Diagnosis present
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
## 2023-10-13 ENCOUNTER — Ambulatory Visit (HOSPITAL_BASED_OUTPATIENT_CLINIC_OR_DEPARTMENT_OTHER): Admitting: Physical Therapy

## 2023-10-13 ENCOUNTER — Encounter (HOSPITAL_BASED_OUTPATIENT_CLINIC_OR_DEPARTMENT_OTHER): Payer: Self-pay | Admitting: Physical Therapy

## 2023-10-13 DIAGNOSIS — M25512 Pain in left shoulder: Secondary | ICD-10-CM

## 2023-10-13 DIAGNOSIS — M545 Low back pain, unspecified: Secondary | ICD-10-CM

## 2023-10-13 DIAGNOSIS — M542 Cervicalgia: Secondary | ICD-10-CM
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
## 2023-10-15 ENCOUNTER — Encounter (HOSPITAL_BASED_OUTPATIENT_CLINIC_OR_DEPARTMENT_OTHER): Payer: Self-pay | Admitting: Physical Therapy

## 2023-10-15 ENCOUNTER — Ambulatory Visit (HOSPITAL_BASED_OUTPATIENT_CLINIC_OR_DEPARTMENT_OTHER): Admitting: Physical Therapy

## 2023-10-15 DIAGNOSIS — M545 Low back pain, unspecified: Secondary | ICD-10-CM | POA: Diagnosis not present

## 2023-10-15 DIAGNOSIS — M542 Cervicalgia: Secondary | ICD-10-CM

## 2023-10-15 DIAGNOSIS — M25512 Pain in left shoulder: Secondary | ICD-10-CM
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
## 2023-10-20 ENCOUNTER — Ambulatory Visit (HOSPITAL_BASED_OUTPATIENT_CLINIC_OR_DEPARTMENT_OTHER): Admitting: Physical Therapy

## 2023-10-20 DIAGNOSIS — M542 Cervicalgia: Secondary | ICD-10-CM

## 2023-10-20 DIAGNOSIS — M545 Low back pain, unspecified: Secondary | ICD-10-CM | POA: Diagnosis not present

## 2023-10-20 DIAGNOSIS — M25512 Pain in left shoulder: Secondary | ICD-10-CM
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
## 2023-10-22 ENCOUNTER — Ambulatory Visit (HOSPITAL_BASED_OUTPATIENT_CLINIC_OR_DEPARTMENT_OTHER): Admitting: Physical Therapy

## 2023-10-22 ENCOUNTER — Encounter (HOSPITAL_BASED_OUTPATIENT_CLINIC_OR_DEPARTMENT_OTHER): Payer: Self-pay | Admitting: Physical Therapy

## 2023-10-22 DIAGNOSIS — M25512 Pain in left shoulder: Secondary | ICD-10-CM

## 2023-10-22 DIAGNOSIS — M545 Low back pain, unspecified: Secondary | ICD-10-CM

## 2023-10-22 DIAGNOSIS — M542 Cervicalgia: Secondary | ICD-10-CM
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
## 2023-10-27 ENCOUNTER — Encounter (HOSPITAL_BASED_OUTPATIENT_CLINIC_OR_DEPARTMENT_OTHER): Payer: Self-pay | Admitting: Physical Therapy

## 2023-10-27 ENCOUNTER — Ambulatory Visit (HOSPITAL_BASED_OUTPATIENT_CLINIC_OR_DEPARTMENT_OTHER): Admitting: Physical Therapy

## 2023-10-27 DIAGNOSIS — M545 Low back pain, unspecified: Secondary | ICD-10-CM | POA: Diagnosis not present

## 2023-10-27 DIAGNOSIS — M542 Cervicalgia: Secondary | ICD-10-CM

## 2023-10-27 DIAGNOSIS — M25512 Pain in left shoulder: Secondary | ICD-10-CM
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
## 2023-10-29 ENCOUNTER — Ambulatory Visit (HOSPITAL_BASED_OUTPATIENT_CLINIC_OR_DEPARTMENT_OTHER): Admitting: Physical Therapy

## 2023-11-03 ENCOUNTER — Encounter (HOSPITAL_BASED_OUTPATIENT_CLINIC_OR_DEPARTMENT_OTHER): Admitting: Physical Therapy

## 2023-11-05 ENCOUNTER — Encounter (HOSPITAL_BASED_OUTPATIENT_CLINIC_OR_DEPARTMENT_OTHER): Admitting: Physical Therapy

## 2023-11-30 ENCOUNTER — Other Ambulatory Visit: Payer: Self-pay

## 2023-11-30 ENCOUNTER — Encounter (HOSPITAL_COMMUNITY): Payer: Self-pay

## 2023-11-30 ENCOUNTER — Emergency Department (HOSPITAL_COMMUNITY)
Admission: EM | Admit: 2023-11-30 | Discharge: 2023-11-30 | Disposition: A | Discharge status: Home / Self Care | Attending: Emergency Medicine | Admitting: Emergency Medicine

## 2023-11-30 ENCOUNTER — Emergency Department (HOSPITAL_COMMUNITY)

## 2023-11-30 ENCOUNTER — Ambulatory Visit (HOSPITAL_COMMUNITY): Admission: EM | Admit: 2023-11-30 | Discharge: 2023-11-30 | Disposition: A | Discharge status: Home / Self Care

## 2023-11-30 DIAGNOSIS — R000 Tachycardia, unspecified: Secondary | ICD-10-CM | POA: Diagnosis not present

## 2023-11-30 DIAGNOSIS — R0602 Shortness of breath: Secondary | ICD-10-CM | POA: Diagnosis not present

## 2023-11-30 DIAGNOSIS — R091 Pleurisy: Secondary | ICD-10-CM | POA: Insufficient documentation

## 2023-11-30 DIAGNOSIS — R079 Chest pain, unspecified: Secondary | ICD-10-CM

## 2023-11-30 LAB — TROPONIN I (HIGH SENSITIVITY)
Troponin I (High Sensitivity): 6 ng/L (ref <18)
Troponin I (High Sensitivity): 6 ng/L (ref <18)

## 2023-11-30 LAB — COMPREHENSIVE METABOLIC PANEL WITH GFR
ALT: 34 U/L (ref 0–44)
AST: 30 U/L (ref 15–41)
Albumin: 4.6 g/dL (ref 3.5–5.0)
Alkaline Phosphatase: 63 U/L (ref 38–126)
Anion gap: 13 (ref 5–15)
BUN: 7 mg/dL (ref 6–20)
CO2: 24 mmol/L (ref 22–32)
Calcium: 10 mg/dL (ref 8.9–10.3)
Chloride: 101 mmol/L (ref 98–111)
Creatinine, Ser: 1.42 mg/dL — ABNORMAL HIGH (ref 0.61–1.24)
GFR, Estimated: >60 mL/min (ref >60)
Glucose, Bld: 88 mg/dL (ref 70–99)
Potassium: 4 mmol/L (ref 3.5–5.1)
Sodium: 138 mmol/L (ref 135–145)
Total Bilirubin: 1.3 mg/dL — ABNORMAL HIGH (ref 0.0–1.2)
Total Protein: 8.4 g/dL — ABNORMAL HIGH (ref 6.5–8.1)

## 2023-11-30 LAB — RESP PANEL BY RT-PCR (RSV, FLU A&B, COVID)  RVPGX2
Influenza A by PCR: NEGATIVE
Influenza B by PCR: NEGATIVE
Resp Syncytial Virus by PCR: NEGATIVE
SARS Coronavirus 2 by RT PCR: NEGATIVE

## 2023-11-30 LAB — CBC
HCT: 46.4 % (ref 39.0–52.0)
Hemoglobin: 15.7 g/dL (ref 13.0–17.0)
MCH: 29.5 pg (ref 26.0–34.0)
MCHC: 33.8 g/dL (ref 30.0–36.0)
MCV: 87.2 fL (ref 80.0–100.0)
Platelets: 242 10*3/uL (ref 150–400)
RBC: 5.32 MIL/uL (ref 4.22–5.81)
RDW: 13.6 % (ref 11.5–15.5)
WBC: 10.3 10*3/uL (ref 4.0–10.5)
nRBC: 0 % (ref 0.0–0.2)

## 2023-11-30 MED ORDER — SODIUM CHLORIDE 0.9 % IV BOLUS
1000.0000 mL | Freq: Once | INTRAVENOUS | Status: AC
  Administered 2023-11-30: 1000 mL via INTRAVENOUS

## 2023-11-30 MED ORDER — NAPROXEN 500 MG PO TABS: 500.0000 mg | ORAL_TABLET | Freq: Two times a day (BID) | ORAL | 0 refills | Status: DC

## 2023-11-30 MED ORDER — HYDROCODONE-ACETAMINOPHEN 5-325 MG PO TABS: 1.0000 | ORAL_TABLET | ORAL | 0 refills | Status: AC | PRN

## 2023-11-30 MED ORDER — KETOROLAC TROMETHAMINE 15 MG/ML IJ SOLN
15.0000 mg | Freq: Once | INTRAMUSCULAR | Status: AC
  Administered 2023-11-30: 15 mg via INTRAVENOUS
  Filled 2023-11-30: qty 1

## 2023-11-30 MED ORDER — FENTANYL CITRATE PF 50 MCG/ML IJ SOSY
50.0000 ug | PREFILLED_SYRINGE | Freq: Once | INTRAMUSCULAR | Status: AC
  Administered 2023-11-30: 50 ug via INTRAVENOUS
  Filled 2023-11-30: qty 1

## 2023-11-30 MED ORDER — IOPAMIDOL (ISOVUE-370) INJECTION 76%
75.0000 mL | Freq: Once | INTRAVENOUS | Status: AC | PRN
  Administered 2023-11-30: 75 mL via INTRAVENOUS

## 2023-11-30 NOTE — ED Triage Notes (Signed)
 3 hours ago pt was sitting at work and began having SOB and chest pain. Chest pain is left sided and sharp. Does not radiate. Hurts when breathing in too long. Sob worse with exertion. Pt denies recent illness or med hx

## 2023-11-30 NOTE — ED Notes (Signed)
 Pt returned from CT via stretcher. Phlebotomy at bedside.

## 2023-11-30 NOTE — Discharge Instructions (Signed)
 Please go to the emergency department for further evaluation of your shortness of breath and chest pain.

## 2023-11-30 NOTE — ED Triage Notes (Addendum)
 Patient presenting with SOB, dry cough,  left side rib pain onset 2 hours ago. Patient thought he had a cold onset 3-4 days ago however abdominal cramping and trouble breathing started 2 hours ago. No smoking on respiratory history according to the Patient. Prescriptions or OTC medications tried: No    Patient states having left side chest pain and tightness as well.

## 2023-11-30 NOTE — ED Provider Notes (Addendum)
 [place] EMERGENCY DEPARTMENT AT [facility] Provider Note   CSN: [id number] Arrival date & time: 11/30/23  1701     History  Chief Complaint  Patient presents with   Shortness of Breath    Jim Norris is a 38 y.o. male.  Patient is a 38 year old male he who presents with chest pain and shortness of breath.  He denies any significant past medical history.  He reports that he has had a little bit of congestion and a mostly dry cough for a few days.  Today about 3 hours ago he was sitting down and developed sudden onset of pain in his left chest which he describes as sharp.  It is worse when he takes a deep breath and sometimes worse when he moves.  He has associated shortness of breath.  No leg pain or swelling.  No vomiting.  No recent fevers.  No underlying history of lung disease.  No prior known heart disease.       Home Medications Prior to Admission medications   Medication Sig Start Date End Date Taking? Authorizing Provider  ibuprofen  (ADVIL ) 600 MG tablet Take 1 tablet (600 mg total) by mouth every 6 (six) hours as needed. 08/23/23   Early Glisson, MD  methocarbamol  (ROBAXIN ) 500 MG tablet Take 1 tablet (500 mg total) by mouth 2 (two) times daily as needed for muscle spasms. 08/23/23   Early Glisson, MD  predniSONE  (DELTASONE ) 10 MG tablet Use as directed per doctors orders for the next 12 days. 09/01/23   Jacobs, Bret C, DO      Allergies    Patient has no known allergies.    Review of Systems   Review of Systems  Constitutional:  Negative for chills, diaphoresis, fatigue and fever.  HENT:  Positive for congestion and rhinorrhea. Negative for sneezing.   Eyes: Negative.   Respiratory:  Positive for cough and shortness of breath. Negative for chest tightness.   Cardiovascular:  Positive for chest pain. Negative for leg swelling.  Gastrointestinal:  Negative for abdominal pain, blood in stool, diarrhea, nausea and vomiting.  Genitourinary:  Negative for  difficulty urinating, flank pain, frequency and hematuria.  Musculoskeletal:  Negative for arthralgias and back pain.  Skin:  Negative for rash.  Neurological:  Negative for dizziness, speech difficulty, weakness, numbness and headaches.    Physical Exam Updated Vital Signs BP (!) 152/95 (BP Location: Left Arm)   Pulse 95   Temp 99.3 F (37.4 C) (Oral)   Resp 18   Ht 5\' 11"  (1.803 m)   Wt 117.9 kg   SpO2 100%   BMI 36.25 kg/m  Physical Exam Constitutional:      Appearance: He is well-developed.  HENT:     Head: Normocephalic and atraumatic.  Eyes:     Pupils: Pupils are equal, round, and reactive to light.  Cardiovascular:     Rate and Rhythm: Normal rate and regular rhythm.     Heart sounds: Normal heart sounds.  Pulmonary:     Effort: Pulmonary effort is normal. No respiratory distress.     Breath sounds: Normal breath sounds. No wheezing or rales.  Chest:     Chest wall: No tenderness.  Abdominal:     General: Bowel sounds are normal.     Palpations: Abdomen is soft.     Tenderness: There is no abdominal tenderness. There is no guarding or rebound.  Musculoskeletal:        General: Normal range of motion.  Cervical back: Normal range of motion and neck supple.     Comments: No edema or calf tenderness  Lymphadenopathy:     Cervical: No cervical adenopathy.  Skin:    General: Skin is warm and dry.     Findings: No rash.  Neurological:     Mental Status: He is alert and oriented to person, place, and time.     ED Results / Procedures / Treatments   Labs (all labs ordered are listed, but only abnormal results are displayed) Labs Reviewed  COMPREHENSIVE METABOLIC PANEL WITH GFR - Abnormal; Notable for the following components:      Result Value   Creatinine, Ser 1.42 (*)    Total Protein 8.4 (*)    Total Bilirubin 1.3 (*)    All other components within normal limits  RESP PANEL BY RT-PCR (RSV, FLU A&B, COVID)  [id number]  CBC  TROPONIN I (HIGH SENSITIVITY)   TROPONIN I (HIGH SENSITIVITY)    EKG EKG Interpretation Date/Time:  Sunday Nov 30 2023 17:11:03 EDT Ventricular Rate:  120 PR Interval:  138 QRS Duration:  74 QT Interval:  312 QTC Calculation: 440 R Axis:   54  Text Interpretation: Sinus tachycardia Nonspecific T wave abnormality Abnormal ECG When compared with ECG of 30-Nov-2023 16:49, PREVIOUS ECG IS PRESENT Confirmed by Hershel Los [phone]) on 11/30/2023 5:49:07 PM  Radiology CT Angio Chest PE W/Cm &/Or Wo Cm Result Date: 11/30/2023 CLINICAL DATA:  High probability pulmonary embolism. Shortness of breath. Chest pain. EXAM: CT ANGIOGRAPHY CHEST WITH CONTRAST TECHNIQUE: Multidetector CT imaging of the chest was performed using the standard protocol during bolus administration of intravenous contrast. Multiplanar CT image reconstructions and MIPs were obtained to evaluate the vascular anatomy. RADIATION DOSE REDUCTION: This exam was performed according to the departmental dose-optimization program which includes automated exposure control, adjustment of the mA and/or kV according to patient size and/or use of iterative reconstruction technique. CONTRAST:  75mL ISOVUE-370 IOPAMIDOL (ISOVUE-370) INJECTION 76% COMPARISON:  Chest radiograph earlier today and CT 08/23/2023 FINDINGS: Cardiovascular: Negative for acute pulmonary embolism. Normal caliber thoracic aorta. No pericardial effusion. Mediastinum/Nodes: Trachea and esophagus are unremarkable. No thoracic adenopathy Lungs/Pleura: No focal consolidation, pleural effusion, or pneumothorax. Upper Abdomen: No acute abnormality. Musculoskeletal: No acute fracture. Review of the MIP images confirms the above findings. IMPRESSION: Negative for acute pulmonary embolism. No acute abnormality in the chest. Electronically Signed   By: Rozell Cornet M.D.   On: 11/30/2023 19:46   DG Chest 1 View Result Date: 11/30/2023 CLINICAL DATA:  Shortness of breath and chest pain. EXAM: CHEST  1 VIEW COMPARISON:   08/23/2023. FINDINGS: The heart size and mediastinal contours are within normal limits. Minimal subsegmental atelectasis or scarring is noted at the left lung base. No consolidation, effusion, or pneumothorax is seen. No acute osseous abnormality. IMPRESSION: No active disease. Electronically Signed   By: Wyvonnia Heimlich M.D.   On: 11/30/2023 17:57    Procedures Procedures    Medications Ordered in ED Medications  fentaNYL (SUBLIMAZE) injection 50 mcg (50 mcg Intravenous Given 11/30/23 1817)  sodium chloride 0.9 % bolus 1,000 mL (0 mLs Intravenous Stopped 11/30/23 2010)  iopamidol (ISOVUE-370) 76 % injection 75 mL (75 mLs Intravenous Contrast Given 11/30/23 1917)  ketorolac  (TORADOL ) 15 MG/ML injection 15 mg (15 mg Intravenous Given 11/30/23 2009)    ED Course/ Medical Decision Making/ A&P  Medical Decision Making Amount and/or Complexity of Data Reviewed Labs: ordered. Radiology: ordered.  Risk Prescription drug management.   Patient is a 38 year old male who presents with left-sided chest pain.  It is worse with inspiration and coughing.  He has had some congestion and mild viral symptoms for the last few days.  He has no hypoxia.  He is tachycardic on arrival but that has improved.  Chest x-ray was interpreted by me and confirmed by the radiologist to show no pneumothorax.  No obvious pneumonia.  No pulmonary edema.  CT of his chest was performed given high suspicion of PE.  There is no evidence of PE.  No pericardial effusion.  No evidence of aortic dissection.  EKG does not show any ischemic changes.  His troponins are negative.  He does not have associated abdominal pain which be more concerning for an abdominal etiology of the symptoms.  Suspect he has pleurisy.  He is feeling better after treatment in the ED. his heart rate is improved down into the 90s.  He was discharged home in good condition.  Was given a prescription for anti-inflammatory/pain  medications to use.  He was encouraged to follow-up with his primary care doctor.  Return precautions were given.  Initially was going to prescribe Naprosyn  for his discomfort but his creatinine is elevated.  It appears to be actually a little bit better than his last check values but given that, advised him to stay away from NSAIDs including Naprosyn .  Will give him a short course of Vicodin. Final Clinical Impression(s) / ED Diagnoses Final diagnoses:  Pleurisy    Rx / DC Orders ED Discharge Orders     None         Hershel Los, MD 11/30/23 2952    Hershel Los, MD 11/30/23 2040

## 2023-11-30 NOTE — ED Notes (Signed)
 CCMD called to place the patient on the monitor, patient information confirmed.

## 2023-11-30 NOTE — ED Notes (Signed)
 Provider at bedside

## 2023-11-30 NOTE — ED Notes (Signed)
 Per MD, pt allowed to eat/drink. Pt provided graham crackers and ginger ale per request.

## 2023-11-30 NOTE — ED Provider Notes (Signed)
 [facility]    CSN: [id number] Arrival date & time: 11/30/23  1624      History   Chief Complaint Chief Complaint  Patient presents with   Shortness of Breath    HPI Jim Norris is a 38 y.o. male.   Patient presents with sudden onset of shortness of breath and left sided chest pain that began 2 hours ago.  Patient states over the last 3 to 4 days he has had mild cough and congestion.  Denies any known fever, body aches, chills, shortness of breath or chest pain prior to today, nausea, vomiting, and diarrhea.  Patient states the pain is so severe in his chest that he is starting to have some abdominal cramping as well.  Patient denies history of asthma, COPD, or smoking.  The history is provided by the patient and medical records.  Shortness of Breath   History reviewed. No pertinent past medical history.  There are no active problems to display for this patient.   History reviewed. No pertinent surgical history.     Home Medications    Prior to Admission medications   Medication Sig Start Date End Date Taking? Authorizing Provider  ibuprofen  (ADVIL ) 600 MG tablet Take 1 tablet (600 mg total) by mouth every 6 (six) hours as needed. 08/23/23   Early Glisson, MD  methocarbamol  (ROBAXIN ) 500 MG tablet Take 1 tablet (500 mg total) by mouth 2 (two) times daily as needed for muscle spasms. 08/23/23   Early Glisson, MD  predniSONE  (DELTASONE ) 10 MG tablet Use as directed per doctors orders for the next 12 days. 09/01/23   Rodgers Clack, DO    Family History Family History  Problem Relation Age of Onset   Healthy Mother    Diabetes Paternal Grandmother     Social History Social History   Tobacco Use   Smoking status: Never   Smokeless tobacco: Never  Vaping Use   Vaping status: Never Used  Substance Use Topics   Alcohol use: No   Drug use: No     Allergies   Patient has no known allergies.   Review of Systems Review of Systems  Respiratory:   Positive for shortness of breath.    Per HPI  Physical Exam Triage Vital Signs ED Triage Vitals  Encounter Vitals Group     BP 11/30/23 1632 (!) 135/95     Systolic BP Percentile --      Diastolic BP Percentile --      Pulse Rate 11/30/23 1632 (!) 137     Resp 11/30/23 1632 (!) 36     Temp 11/30/23 1632 99.3 F (37.4 C)     Temp Source 11/30/23 1632 Oral     SpO2 11/30/23 1632 99 %     Weight --      Height --      Head Circumference --      Peak Flow --      Pain Score 11/30/23 1631 7     Pain Loc --      Pain Education --      Exclude from Growth Chart --    No data found.  Updated Vital Signs BP (!) 135/95 (BP Location: Left Arm)   Pulse (!) 137   Temp 99.3 F (37.4 C) (Oral)   Resp (!) 36   SpO2 99%   Visual Acuity Right Eye Distance:   Left Eye Distance:   Bilateral Distance:    Right Eye Near:  Left Eye Near:    Bilateral Near:     Physical Exam Vitals and nursing note reviewed.  Constitutional:      General: He is awake. He is in acute distress.     Appearance: Normal appearance. He is well-developed and well-groomed.  HENT:     Nose: Congestion and rhinorrhea present.  Cardiovascular:     Rate and Rhythm: Regular rhythm. Tachycardia present.  Pulmonary:     Effort: Tachypnea present.     Breath sounds: Normal breath sounds.  Skin:    General: Skin is warm and dry.  Neurological:     Mental Status: He is alert.  Psychiatric:        Behavior: Behavior is cooperative.      UC Treatments / Results  Labs (all labs ordered are listed, but only abnormal results are displayed) Labs Reviewed - No data to display  EKG   Radiology No results found.  Procedures Procedures (including critical care time)  Medications Ordered in UC Medications - No data to display  Initial Impression / Assessment and Plan / UC Course  I have reviewed the triage vital signs and the nursing notes.  Pertinent labs & imaging results that were available  during my care of the patient were reviewed by me and considered in my medical decision making (see chart for details).     Patient appears to be in acute distress.  Tachycardia at 137 initially and tachypnea at 36 noted.  There is some mild congestion and rhinorrhea present.  Lungs clear bilaterally to auscultation.  EKG revealed sinus tachycardia with nonspecific T wave abnormality without ST elevation, depression, or acute cardiac findings.  Due to sudden onset of symptoms, presentation, and vital signs recommended patient be seen in the emergency department for further evaluation.  Patient is agreeable to plan at this time.  Patient is stable to arrived to the ER via POV. Final Clinical Impressions(s) / UC Diagnoses   Final diagnoses:  Shortness of breath  Chest pain, unspecified type  Tachycardia     Discharge Instructions      Please go to the emergency department for further evaluation of your shortness of breath and chest pain.  ED Prescriptions   None    PDMP not reviewed this encounter.   Levora Reas A, NP 11/30/23 1700

## 2023-11-30 NOTE — ED Notes (Signed)
 Patient transported to CT

## 2023-11-30 NOTE — ED Notes (Signed)
 Patient is being discharged from the Urgent Care and sent to the Emergency Department via personal opperated vehicle . Per Levora Reas NP, patient is in need of higher level of care due to tachypnea and tachycardia with chest pain. Patient is aware and verbalizes understanding of plan of care.  Vitals:   11/30/23 1632  BP: (!) 135/95  Pulse: (!) 137  Resp: (!) 36  Temp: 99.3 F (37.4 C)  SpO2: 99%

## 2023-11-30 NOTE — ED Notes (Signed)
 ED Provider at bedside.

## 2023-12-08 ENCOUNTER — Emergency Department (HOSPITAL_COMMUNITY)

## 2023-12-08 ENCOUNTER — Other Ambulatory Visit: Payer: Self-pay

## 2023-12-08 ENCOUNTER — Emergency Department (HOSPITAL_COMMUNITY)
Admission: EM | Admit: 2023-12-08 | Discharge: 2023-12-09 | Discharge status: Against Medical Advice | Attending: Emergency Medicine | Admitting: Emergency Medicine

## 2023-12-08 ENCOUNTER — Encounter (HOSPITAL_COMMUNITY): Payer: Self-pay

## 2023-12-08 DIAGNOSIS — R059 Cough, unspecified: Secondary | ICD-10-CM | POA: Insufficient documentation

## 2023-12-08 DIAGNOSIS — R197 Diarrhea, unspecified: Secondary | ICD-10-CM | POA: Diagnosis not present

## 2023-12-08 DIAGNOSIS — R509 Fever, unspecified: Secondary | ICD-10-CM | POA: Diagnosis not present

## 2023-12-08 DIAGNOSIS — J029 Acute pharyngitis, unspecified: Secondary | ICD-10-CM | POA: Insufficient documentation

## 2023-12-08 DIAGNOSIS — Z5321 Procedure and treatment not carried out due to patient leaving prior to being seen by health care provider: Secondary | ICD-10-CM | POA: Insufficient documentation

## 2023-12-08 LAB — RESP PANEL BY RT-PCR (RSV, FLU A&B, COVID)  RVPGX2
Influenza A by PCR: NEGATIVE
Influenza B by PCR: NEGATIVE
Resp Syncytial Virus by PCR: NEGATIVE
SARS Coronavirus 2 by RT PCR: NEGATIVE

## 2023-12-08 LAB — GROUP A STREP BY PCR: Group A Strep by PCR: NOT DETECTED

## 2023-12-08 NOTE — ED Provider Triage Note (Signed)
 Emergency Medicine Provider Triage Evaluation Note  Jim Norris , a 38 y.o. male  was evaluated in triage.  Pt complains of cough, sore throat, fever at home.  Symptoms ongoing for around 1 week.  Some improvement with Tylenol . Endorses some loose stools  Review of Systems  Positive: Cough, sore throat Negative:   Physical Exam  BP 127/81 (BP Location: Left Arm)   Pulse 91   Temp 98 F (36.7 C)   Resp 18   Ht 5\' 11"  (1.803 m)   Wt 83.9 kg   SpO2 99%   BMI 25.80 kg/m  Gen:   Awake, no distress   Resp:  Normal effort  MSK:   Moves extremities without difficulty  Other:  Dry cough, clear posterior oropharynx  Medical Decision Making  Medically screening exam initiated at 8:44 PM.  Appropriate orders placed.  Jim Norris was informed that the remainder of the evaluation will be completed by another provider, this initial triage assessment does not replace that evaluation, and the importance of remaining in the ED until their evaluation is complete.  Workup initiated in triage    Nelly Banco, [place] 12/08/23 2045

## 2023-12-08 NOTE — ED Triage Notes (Signed)
 PT arrived POV  from home c/o a cough x1 week and a sore throat. Pt states he coughs so hard that he almost throws up.

## 2023-12-09 NOTE — ED Notes (Signed)
 Patient did not answer when called to retake V/S.

## 2023-12-09 NOTE — ED Notes (Signed)
 Called for repeat vitals x3, no answer

## 2023-12-13 ENCOUNTER — Emergency Department (HOSPITAL_BASED_OUTPATIENT_CLINIC_OR_DEPARTMENT_OTHER)

## 2023-12-13 ENCOUNTER — Encounter (HOSPITAL_BASED_OUTPATIENT_CLINIC_OR_DEPARTMENT_OTHER): Payer: Self-pay

## 2023-12-13 ENCOUNTER — Other Ambulatory Visit: Payer: Self-pay

## 2023-12-13 ENCOUNTER — Other Ambulatory Visit (HOSPITAL_BASED_OUTPATIENT_CLINIC_OR_DEPARTMENT_OTHER): Payer: Self-pay

## 2023-12-13 ENCOUNTER — Emergency Department (HOSPITAL_BASED_OUTPATIENT_CLINIC_OR_DEPARTMENT_OTHER)
Admission: EM | Admit: 2023-12-13 | Discharge: 2023-12-13 | Disposition: A | Discharge status: Home / Self Care | Attending: Emergency Medicine | Admitting: Emergency Medicine

## 2023-12-13 DIAGNOSIS — J40 Bronchitis, not specified as acute or chronic: Secondary | ICD-10-CM | POA: Diagnosis not present

## 2023-12-13 DIAGNOSIS — R059 Cough, unspecified: Secondary | ICD-10-CM | POA: Diagnosis present

## 2023-12-13 MED ORDER — AZITHROMYCIN 250 MG PO TABS
250.0000 mg | ORAL_TABLET | Freq: Every day | ORAL | 0 refills | Status: AC
  Filled 2023-12-13: qty 6, 5d supply, fill #0

## 2023-12-13 MED ORDER — METHYLPREDNISOLONE 4 MG PO TBPK
ORAL_TABLET | ORAL | 0 refills | Status: AC
  Filled 2023-12-13: qty 21, 6d supply, fill #0

## 2023-12-13 NOTE — ED Notes (Signed)
 RT Note: Upon assessing patient there was no wheezing noted

## 2023-12-13 NOTE — ED Triage Notes (Addendum)
 Productive cough with yellow sputum, fever. Post tussive emesis. Fatigue. Diarrhea.

## 2023-12-13 NOTE — ED Provider Notes (Signed)
 [place] EMERGENCY DEPARTMENT AT [facility] Provider Note   CSN: [id number] Arrival date & time: 12/13/23  6962     History  Chief Complaint  Patient presents with   Cough    Jim Norris is a 38 y.o. male.  Patient here with flulike symptoms here for the last couple days.  Was seen over a week ago had unremarkable workup.  Came back couple days later but the wait was too long and ended up leaving.  Continues maybe with a little bit of a productive cough.  Some loose stools at times.  Denies any fever.  He continues to feel discomfort when he coughs but no chest pain at rest or with exertion.  No shortness of breath.  Denies any weakness numbness tingling.  Denies any abdominal pain nausea vomiting diarrhea.  The history is provided by the patient.       Home Medications Prior to Admission medications   Medication Sig Start Date End Date Taking? Authorizing Provider  azithromycin (ZITHROMAX) 250 MG tablet Take 1 tablet (250 mg total) by mouth daily. Take first 2 tablets together, then 1 every day until finished. 12/13/23  Yes Tekeyah Santiago, DO  methylPREDNISolone  (MEDROL  DOSEPAK) 4 MG TBPK tablet Follow package insert 12/13/23  Yes Melika Reder, DO  HYDROcodone -acetaminophen  (NORCO/VICODIN) 5-325 MG tablet Take 1-2 tablets by mouth every 4 (four) hours as needed. 11/30/23   Hershel Los, MD  ibuprofen  (ADVIL ) 600 MG tablet Take 1 tablet (600 mg total) by mouth every 6 (six) hours as needed. 08/23/23   Early Glisson, MD  methocarbamol  (ROBAXIN ) 500 MG tablet Take 1 tablet (500 mg total) by mouth 2 (two) times daily as needed for muscle spasms. 08/23/23   Early Glisson, MD  predniSONE  (DELTASONE ) 10 MG tablet Use as directed per doctors orders for the next 12 days. 09/01/23   Jacobs, Bret C, DO      Allergies    Patient has no known allergies.    Review of Systems   Review of Systems  Physical Exam Updated Vital Signs BP (!) 139/90 (BP Location: Right Arm)   Pulse 79    Temp 98.2 F (36.8 C)   Resp 18   SpO2 100%  Physical Exam Vitals and nursing note reviewed.  Constitutional:      General: He is not in acute distress.    Appearance: He is well-developed. He is not ill-appearing.  HENT:     Head: Normocephalic and atraumatic.     Nose: Nose normal.     Mouth/Throat:     Mouth: Mucous membranes are moist.  Eyes:     Extraocular Movements: Extraocular movements intact.     Conjunctiva/sclera: Conjunctivae normal.     Pupils: Pupils are equal, round, and reactive to light.  Cardiovascular:     Rate and Rhythm: Normal rate and regular rhythm.     Pulses: Normal pulses.     Heart sounds: Normal heart sounds. No murmur heard. Pulmonary:     Effort: Pulmonary effort is normal. No respiratory distress.     Breath sounds: Normal breath sounds.  Abdominal:     General: Abdomen is flat.     Palpations: Abdomen is soft.     Tenderness: There is no abdominal tenderness.  Musculoskeletal:        General: No swelling.     Cervical back: Normal range of motion and neck supple.  Skin:    General: Skin is warm and dry.     Capillary  Refill: Capillary refill takes less than 2 seconds.  Neurological:     General: No focal deficit present.     Mental Status: He is alert and oriented to person, place, and time.     Cranial Nerves: No cranial nerve deficit.     Sensory: No sensory deficit.     Motor: No weakness.     Coordination: Coordination normal.  Psychiatric:        Mood and Affect: Mood normal.     ED Results / Procedures / Treatments   Labs (all labs ordered are listed, but only abnormal results are displayed) Labs Reviewed - No data to display  EKG EKG Interpretation Date/Time:  Saturday December 13 2023 09:03:01 EDT Ventricular Rate:  71 PR Interval:  129 QRS Duration:  91 QT Interval:  418 QTC Calculation: 455 R Axis:   68  Text Interpretation: Sinus rhythm Confirmed by Lowery Rue [phone]) on 12/13/2023 9:04:45 AM  Radiology DG  Chest Portable 1 View Result Date: 12/13/2023 CLINICAL DATA:  Cough. EXAM: PORTABLE CHEST 1 VIEW COMPARISON:  Chest radiograph dated 12/08/2023. FINDINGS: The heart size and mediastinal contours are within normal limits. Both lungs are clear. The visualized skeletal structures are unremarkable. IMPRESSION: No active disease. Electronically Signed   By: Angus Bark M.D.   On: 12/13/2023 08:56    Procedures Procedures    Medications Ordered in ED Medications - No data to display  ED Course/ Medical Decision Making/ A&P                                 Medical Decision Making Amount and/or Complexity of Data Reviewed Radiology: ordered.  Risk Prescription drug management.   Jim Norris is here with ongoing cough.  Normal vitals.  No fever.  Seen here recently had negative CT scan of his chest troponins viral testing and chest x-rays.  Left without being seen after he showed up a couple days ago for the same.  No smoking history no asthma history.  Not having any chest pain and shortness of breath.  Maybe some loose stools recently but no abdominal pain.  He is very well-appearing on exam.  Clear breath sounds.  Differential diagnosis likely bronchitis viral process but will get new x-ray to see if the pneumonia is developed.  I have no concern for ACS PE or other acute process given history and physical and recent workup.  Seems less likely to be a pericarditis or myocarditis.  Will check EKG.  Will get chest x-ray.  EKG shows sinus rhythm.  No ischemic changes.  No pericarditis changes.  Overall chest x-ray no evidence of pneumonia or pneumothorax.  Will treat for bronchitis with steroids and antibiotics.  Follow-up with primary care doctor.  Discharge.  This chart was dictated using voice recognition software.  Despite best efforts to proofread,  errors can occur which can change the documentation meaning.         Final Clinical Impression(s) / ED Diagnoses Final diagnoses:   Bronchitis    Rx / DC Orders ED Discharge Orders          Ordered    methylPREDNISolone  (MEDROL  DOSEPAK) 4 MG TBPK tablet        12/13/23 0859    azithromycin (ZITHROMAX) 250 MG tablet  Daily        12/13/23 0859              Lowery Rue,  DO 12/13/23 0981

## 2024-06-02 ENCOUNTER — Encounter (HOSPITAL_BASED_OUTPATIENT_CLINIC_OR_DEPARTMENT_OTHER): Payer: Self-pay

## 2024-06-02 ENCOUNTER — Emergency Department (HOSPITAL_BASED_OUTPATIENT_CLINIC_OR_DEPARTMENT_OTHER)
Admission: EM | Admit: 2024-06-02 | Discharge: 2024-06-02 | Disposition: A | Discharge status: Home / Self Care | Attending: Emergency Medicine | Admitting: Emergency Medicine

## 2024-06-02 ENCOUNTER — Other Ambulatory Visit: Payer: Self-pay

## 2024-06-02 DIAGNOSIS — M545 Low back pain, unspecified: Secondary | ICD-10-CM | POA: Diagnosis present

## 2024-06-02 DIAGNOSIS — X503XXA Overexertion from repetitive movements, initial encounter: Secondary | ICD-10-CM | POA: Insufficient documentation

## 2024-06-02 MED ORDER — METHOCARBAMOL 500 MG PO TABS: 500.0000 mg | ORAL_TABLET | Freq: Two times a day (BID) | ORAL | 0 refills | Status: DC

## 2024-06-02 MED ORDER — ACETAMINOPHEN 500 MG PO TABS
1000.0000 mg | ORAL_TABLET | Freq: Once | ORAL | Status: AC
  Administered 2024-06-02: 1000 mg via ORAL
  Filled 2024-06-02: qty 2

## 2024-06-02 MED ORDER — OXYCODONE HCL 5 MG PO TABS
5.0000 mg | ORAL_TABLET | Freq: Once | ORAL | Status: AC
  Administered 2024-06-02: 5 mg via ORAL
  Filled 2024-06-02: qty 1

## 2024-06-02 MED ORDER — DIAZEPAM 5 MG PO TABS
5.0000 mg | ORAL_TABLET | Freq: Once | ORAL | Status: AC
  Administered 2024-06-02: 5 mg via ORAL
  Filled 2024-06-02: qty 1

## 2024-06-02 MED ORDER — KETOROLAC TROMETHAMINE 15 MG/ML IJ SOLN
15.0000 mg | Freq: Once | INTRAMUSCULAR | Status: AC
  Administered 2024-06-02: 15 mg via INTRAMUSCULAR
  Filled 2024-06-02: qty 1

## 2024-06-02 NOTE — ED Triage Notes (Signed)
 Pt reports lower back pain after bending over.

## 2024-06-02 NOTE — ED Provider Notes (Signed)
 [place] EMERGENCY DEPARTMENT AT [facility] Provider Note   CSN: [id number] Arrival date & time: 06/02/24  2219     Patient presents with: Back Pain   Jim Norris is a 38 y.o. male.   38 yo M with a cc of low back pain.  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  He had repetitive bending motions and had developed worsening low back pain over the course of the day.  He said by the end of the day it hurt so bad that he could not stand up straight and he decided to call EMS to be seen here.  He denies trauma denies loss of bowel or bladder denies loss peripheral sensation denies numbness or weakness to the legs.  Denies fevers.  Had something similar happen about 15 years ago.   Back Pain      Prior to Admission medications   Medication Sig Start Date End Date Taking? Authorizing Provider  methocarbamol  (ROBAXIN ) 500 MG tablet Take 1 tablet (500 mg total) by mouth 2 (two) times daily. 06/02/24  Yes Emil Share, DO  azithromycin  (ZITHROMAX ) 250 MG tablet Take 1 tablet (250 mg total) by mouth daily. Take first 2 tablets together, then 1 every day until finished. 12/13/23   Curatolo, Adam, DO  HYDROcodone -acetaminophen  (NORCO/VICODIN) 5-325 MG tablet Take 1-2 tablets by mouth every 4 (four) hours as needed. 11/30/23   Lenor Hollering, MD  ibuprofen  (ADVIL ) 600 MG tablet Take 1 tablet (600 mg total) by mouth every 6 (six) hours as needed. 08/23/23   Cleotilde Rogue, MD  methylPREDNISolone  (MEDROL  DOSEPAK) 4 MG TBPK tablet Follow package insert 12/13/23   Curatolo, Adam, DO  predniSONE  (DELTASONE ) 10 MG tablet Use as directed per doctors orders for the next 12 days. 09/01/23   Jacobs, Bret C, DO    Allergies: Patient has no known allergies.    Review of Systems  Musculoskeletal:  Positive for back pain.    Updated Vital Signs BP 118/69   Pulse 73   Temp 98.1 F (36.7 C) (Oral)   Resp 18   Ht 6' 1 (1.854 m)   Wt 122.5 kg    SpO2 100%   BMI 35.62 kg/m   Physical Exam Vitals and nursing note reviewed.  Constitutional:      Appearance: He is well-developed.  HENT:     Head: Normocephalic and atraumatic.  Eyes:     Pupils: Pupils are equal, round, and reactive to light.  Neck:     Vascular: No JVD.  Cardiovascular:     Rate and Rhythm: Normal rate and regular rhythm.     Heart sounds: No murmur heard.    No friction rub. No gallop.  Pulmonary:     Effort: No respiratory distress.     Breath sounds: No wheezing.  Abdominal:     General: There is no distension.     Tenderness: There is no abdominal tenderness. There is no guarding or rebound.  Musculoskeletal:        General: Normal range of motion.     Cervical back: Normal range of motion and neck supple.     Comments: Pulse motor and sensation intact in bilateral lower extremities.  Reflexes are 2+ and equal.  There is no clonus.  No obvious midline spinal tenderness step-offs or deformities.  He has some paraspinal musculature tenderness worse about the L-spine bilaterally.  Skin:    Coloration: Skin is not  pale.     Findings: No rash.  Neurological:     Mental Status: He is alert and oriented to person, place, and time.  Psychiatric:        Behavior: Behavior normal.     (all labs ordered are listed, but only abnormal results are displayed) Labs Reviewed - No data to display  EKG: None  Radiology: No results found.   Procedures   Medications Ordered in the ED  ketorolac  (TORADOL ) 15 MG/ML injection 15 mg (has no administration in time range)  acetaminophen  (TYLENOL ) tablet 1,000 mg (has no administration in time range)  oxyCODONE  (Oxy IR/ROXICODONE ) immediate release tablet 5 mg (has no administration in time range)  diazepam  (VALIUM ) tablet 5 mg (has no administration in time range)                                    Medical Decision Making Risk OTC drugs. Prescription drug management.   39 yo M with a chief complaints  of low back pain.  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  No red flags.  Benign exam.  Will treat supportively.  PCP follow-up.  11:06 PM:  I have discussed the diagnosis/risks/treatment options with the patient and family.  Evaluation and diagnostic testing in the emergency department does not suggest an emergent condition requiring admission or immediate intervention beyond what has been performed at this time.  They will follow up with PCP. We also discussed returning to the ED immediately if new or worsening sx occur. We discussed the sx which are most concerning (e.g., sudden worsening pain, fever, inability to tolerate by mouth, cauda equina s/sx) that necessitate immediate return. Medications administered to the patient during their visit and any new prescriptions provided to the patient are listed below.  Medications given during this visit Medications  ketorolac  (TORADOL ) 15 MG/ML injection 15 mg (has no administration in time range)  acetaminophen  (TYLENOL ) tablet 1,000 mg (has no administration in time range)  oxyCODONE  (Oxy IR/ROXICODONE ) immediate release tablet 5 mg (has no administration in time range)  diazepam  (VALIUM ) tablet 5 mg (has no administration in time range)     The patient appears reasonably screen and/or stabilized for discharge and I doubt any other medical condition or other [facility] requiring further screening, evaluation, or treatment in the ED at this time prior to discharge.       Final diagnoses:  Acute bilateral low back pain without sciatica    ED Discharge Orders          Ordered    methocarbamol  (ROBAXIN ) 500 MG tablet  2 times daily        06/02/24 2304               Emil Share, DO 06/02/24 2306

## 2024-06-02 NOTE — Discharge Instructions (Signed)
 Your back pain is most likely due to a muscular strain.  There is been a lot of research on back pain, unfortunately the only thing that seems to really help is Tylenol and ibuprofen.  Relative rest is also important to not lift greater than 10 pounds bending or twisting at the waist.  Please follow-up with your family physician.  The other thing that really seems to benefit patients is physical therapy which your doctor may send you for.  Please return to the emergency department for new numbness or weakness to your arms or legs. Difficulty with urinating or urinating or pooping on yourself.  Also if you cannot feel toilet paper when you wipe or get a fever.   Take 4 over the counter ibuprofen tablets 3 times a day or 2 over-the-counter naproxen tablets twice a day for pain. Also take tylenol 1000mg (2 extra strength) four times a day.   Stretches can also help try: [email or web address]

## 2024-06-03 ENCOUNTER — Encounter (HOSPITAL_BASED_OUTPATIENT_CLINIC_OR_DEPARTMENT_OTHER): Payer: Self-pay | Admitting: Emergency Medicine

## 2024-06-03 ENCOUNTER — Telehealth (HOSPITAL_BASED_OUTPATIENT_CLINIC_OR_DEPARTMENT_OTHER): Payer: Self-pay | Admitting: Emergency Medicine

## 2024-06-03 ENCOUNTER — Other Ambulatory Visit: Payer: Self-pay

## 2024-06-03 ENCOUNTER — Emergency Department (HOSPITAL_BASED_OUTPATIENT_CLINIC_OR_DEPARTMENT_OTHER)
Admission: EM | Admit: 2024-06-03 | Discharge: 2024-06-03 | Disposition: A | Discharge status: Home / Self Care | Attending: Emergency Medicine | Admitting: Emergency Medicine

## 2024-06-03 DIAGNOSIS — S3992XA Unspecified injury of lower back, initial encounter: Secondary | ICD-10-CM | POA: Diagnosis present

## 2024-06-03 DIAGNOSIS — S39012D Strain of muscle, fascia and tendon of lower back, subsequent encounter: Secondary | ICD-10-CM

## 2024-06-03 DIAGNOSIS — S39012A Strain of muscle, fascia and tendon of lower back, initial encounter: Secondary | ICD-10-CM | POA: Insufficient documentation

## 2024-06-03 DIAGNOSIS — Z79899 Other long term (current) drug therapy: Secondary | ICD-10-CM | POA: Diagnosis not present

## 2024-06-03 DIAGNOSIS — X58XXXA Exposure to other specified factors, initial encounter: Secondary | ICD-10-CM | POA: Insufficient documentation

## 2024-06-03 MED ORDER — LIDOCAINE 5 % EX PTCH
2.0000 | MEDICATED_PATCH | CUTANEOUS | Status: DC
  Administered 2024-06-03: 2 via TRANSDERMAL
  Filled 2024-06-03: qty 2

## 2024-06-03 MED ORDER — KETOROLAC TROMETHAMINE 15 MG/ML IJ SOLN
15.0000 mg | Freq: Once | INTRAMUSCULAR | Status: AC
  Administered 2024-06-03: 15 mg via INTRAMUSCULAR
  Filled 2024-06-03: qty 1

## 2024-06-03 MED ORDER — METHOCARBAMOL 500 MG PO TABS
1000.0000 mg | ORAL_TABLET | Freq: Once | ORAL | Status: AC
  Administered 2024-06-03: 1000 mg via ORAL
  Filled 2024-06-03: qty 2

## 2024-06-03 MED ORDER — LIDOCAINE 5 % EX PTCH: 1.0000 | MEDICATED_PATCH | CUTANEOUS | 0 refills | Status: AC

## 2024-06-03 NOTE — Discharge Instructions (Addendum)
 Please engage in light physical activity (like walking) to prevent your back pain from worsening and to prevent stiffness. Refrain from bedrest which can make your pain worse.   You may use up to 600mg  ibuprofen  every 6 hours as needed for pain.  Do not exceed 2.4g of ibuprofen  per day.  You were given a dose of a similar medication here today, you next dose of ibuprofen  can be no sooner than 7pm tonight.  You may take up to 1000mg  of tylenol  every 6 hours as needed for pain.  Do not take more then 4g per day.    You may use a heating pack on your back to help with the pain.  You were prescribed a muscle relaxer yesterday called Robaxin  (methocarbamol ). You may  take this as prescribed for muscle pain. This medication can be sedating. Do not drive or operate heavy machinery after taking this medicine. Do not drink alcohol or take other sedating medications when taking this medicine for safety reasons.  Keep this out of reach of small children.  You were given a dose here today, your next dose can be no sooner than 7pm  You have been prescribed lidocaine  patches to help with pain. You may apply one patch to your back for up to 12 hours at a time. Then, you must remove the patch for a full 12 hours before re-applying a new patch.    Please contact your PCP if your back pain does not start to improve over the next 2-3 weeks as you may benefit from a PT referral from your PCP.   Return to the ER if you have loss of bowel or bladder control, you develop fever, you have numbness in your groin, or if you have any other new or concerning symptoms.

## 2024-06-03 NOTE — ED Notes (Signed)
 Reviewed AVS/discharge instructions with patient. Time allotted for and all questions answered. Patient is agreeable for d/c and escorted to ED exit by staff.

## 2024-06-03 NOTE — ED Provider Notes (Signed)
 [place] EMERGENCY DEPARTMENT AT [facility] Provider Note   CSN: [id number] Arrival date & time: 06/03/24  1056     Patient presents with: Back Pain   Jim Norris is a 38 y.o. male with no significant past medical history presents with concern for lower back pain that started yesterday.  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  He was seen in the emergency room yesterday for evaluation of this concern.  He states that the Robaxin  he was prescribed yesterday was sent to the wrong pharmacy and this is what brought him back to the emergency room today.  He denies any midline spinal pain.  Denies any new injuries to his back.  Denies any bowel or bladder incontinence, saddle anesthesia.  Denies any pain that radiates into the legs.  No numbness in his legs.  Denies any history of IV drug use.  No fevers.    Back Pain      Prior to Admission medications   Medication Sig Start Date End Date Taking? Authorizing Provider  lidocaine  (LIDODERM ) 5 % Place 1-2 patches onto the skin daily. Remove & Discard patch within 12 hours or as directed by MD 06/03/24  Yes Veta Palma, PA-C  azithromycin  (ZITHROMAX ) 250 MG tablet Take 1 tablet (250 mg total) by mouth daily. Take first 2 tablets together, then 1 every day until finished. 12/13/23   Curatolo, Adam, DO  cyclobenzaprine  (FLEXERIL ) 10 MG tablet Take 10 mg by mouth 2 (two) times daily as needed for muscle spasms.    [provider]  HYDROcodone -acetaminophen  (NORCO/VICODIN) 5-325 MG tablet Take 1-2 tablets by mouth every 4 (four) hours as needed. 11/30/23   Lenor Hollering, MD  ibuprofen  (ADVIL ) 600 MG tablet Take 1 tablet (600 mg total) by mouth every 6 (six) hours as needed. 08/23/23   Cleotilde Rogue, MD  methylPREDNISolone  (MEDROL  DOSEPAK) 4 MG TBPK tablet Follow package insert 12/13/23   Curatolo, Adam, DO  predniSONE   (DELTASONE ) 10 MG tablet Use as directed per doctors orders for the next 12 days. 09/01/23   Jacobs, Bret C, DO    Allergies: Patient has no known allergies.    Review of Systems  Musculoskeletal:  Positive for back pain.    Updated Vital Signs BP 132/83 (BP Location: Right Arm)   Pulse 70   Temp 97.8 F (36.6 C)   Resp 18   SpO2 100%   Physical Exam Vitals and nursing note reviewed.  Constitutional:      General: He is not in acute distress.    Appearance: He is well-developed.  HENT:     Head: Normocephalic and atraumatic.  Eyes:     Conjunctiva/sclera: Conjunctivae normal.  Cardiovascular:     Rate and Rhythm: Normal rate and regular rhythm.     Heart sounds: No murmur heard.    Comments: 2+ pedal pulses bilaterally Pulmonary:     Effort: Pulmonary effort is normal. No respiratory distress.     Breath sounds: Normal breath sounds.  Abdominal:     Palpations: Abdomen is soft.     Tenderness: There is no abdominal tenderness.  Musculoskeletal:        General: No swelling.     Cervical back: Neck supple.     Comments: No cervical, thoracic, or lumbar spinal tenderness to palpation  Mild tenderness to the lumbar musculature bilaterally  Moves arms  and legs without difficulty.  Ambulates without difficulty  Skin:    General: Skin is warm and dry.     Capillary Refill: Capillary refill takes less than 2 seconds.  Neurological:     Mental Status: He is alert.     Comments: 5/5 strength with resisted hip flexion and extension, knee flexion extension, ankle plantarflexion and dorsiflexion bilaterally  Intact sensation to the bilateral lower extremities  Psychiatric:        Mood and Affect: Mood normal.     (all labs ordered are listed, but only abnormal results are displayed) Labs Reviewed - No data to display  EKG: None  Radiology: No results found.   Procedures   Medications Ordered in the ED  methocarbamol  (ROBAXIN ) tablet 1,000 mg (1,000 mg Oral  Given 06/03/24 1255)  ketorolac  (TORADOL ) 15 MG/ML injection 15 mg (15 mg Intramuscular Given 06/03/24 1255)                                    Medical Decision Making Risk Prescription drug management.     Differential diagnosis includes but is not limited to Musculoskeletal pain, radiculopathy, spinal stenosis, herniated nucleus pulposis, fracture, cauda equina, epidural abscess, shingles, nephrolithiasis    ED Course:  Upon initial evaluation, patient is very well-appearing, no acute distress.  Normal vital signs.  He denies any new injuries since yesterday's evaluation.  He does not have any spinal tenderness to palpation of the cervical, thoracic, lumbar spine.  He does not have any other red flag symptoms such as bowel or bladder incontinence, saddle anesthesia, fevers, or any history of IV drug use.  He is neurovascularly intact in the legs bilaterally.  Do not feel he needs any imaging at this time. Patient is mildly tender over the lumbar musculature.  Suspect musculoskeletal pain given the repetitive bending movements he reports yesterday when scooping ice cream.  Denies any numbness or tingling in the lower extremities, low concern for radiculopathy. Patient called his pharmacy while in the emergency room and they were able to fill his Robaxin  prescription that was prescribed yesterday.  I offered to resend the prescription if needed, but he declines.  Patient requests discharge home since the pharmacy has his prescription.  I feel patient is stable and appropriate for discharge home  Medications Given: Lidocaine  patches Robaxin   Impression: Lumbar strain  Disposition:  The patient was discharged home with instructions to take Tylenol  and ibuprofen  as needed for pain.  Lidocaine  patches as needed for pain.  Robaxin  was prescribed from yesterday, and he understands he may take this as needed for pain.  He understands that Robaxin  may make him drowsy and he should not drink  alcohol or drive after taking this medication. Return precautions given and patient verbalized understanding.    Record Review: External records from outside source obtained and reviewed including ER note from yesterday where he was seen for his lower back pain     This chart was dictated using voice recognition software, Dragon. Despite the best efforts of this provider to proofread and correct errors, errors may still occur which can change documentation meaning.       Final diagnoses:  Strain of lumbar region, subsequent encounter    ED Discharge Orders          Ordered    lidocaine  (LIDODERM ) 5 %  Every 24 hours        06/03/24  1304               Veta Palma, PA-C 06/03/24 1819    Yolande Lamar BROCKS, MD 06/04/24 [phone]

## 2024-06-03 NOTE — ED Triage Notes (Signed)
 Lower back pain Seen last night  Med mix up Reports 10/10 pain

## 2024-06-03 NOTE — Telephone Encounter (Signed)
 [facility] on [address] does not have the robaxin .  Will send Flexeril  to the [facility] on [address].  Have verified that they have this medication

## 2024-06-06 ENCOUNTER — Emergency Department (HOSPITAL_BASED_OUTPATIENT_CLINIC_OR_DEPARTMENT_OTHER): Admitting: Radiology

## 2024-06-06 ENCOUNTER — Other Ambulatory Visit: Payer: Self-pay

## 2024-06-06 ENCOUNTER — Emergency Department (HOSPITAL_BASED_OUTPATIENT_CLINIC_OR_DEPARTMENT_OTHER)
Admission: EM | Admit: 2024-06-06 | Discharge: 2024-06-06 | Disposition: A | Discharge status: Home / Self Care | Attending: Emergency Medicine | Admitting: Emergency Medicine

## 2024-06-06 DIAGNOSIS — M545 Low back pain, unspecified: Secondary | ICD-10-CM | POA: Insufficient documentation

## 2024-06-06 MED ORDER — HYDROCODONE-ACETAMINOPHEN 5-325 MG PO TABS: 1.0000 | ORAL_TABLET | Freq: Four times a day (QID) | ORAL | 0 refills | Status: AC | PRN

## 2024-06-06 MED ORDER — KETOROLAC TROMETHAMINE 15 MG/ML IJ SOLN
15.0000 mg | Freq: Once | INTRAMUSCULAR | Status: AC
  Administered 2024-06-06: 15 mg via INTRAMUSCULAR
  Filled 2024-06-06: qty 1

## 2024-06-06 MED ORDER — NAPROXEN 500 MG PO TABS: 500.0000 mg | ORAL_TABLET | Freq: Two times a day (BID) | ORAL | 0 refills | Status: AC

## 2024-06-06 MED ORDER — METHOCARBAMOL 500 MG PO TABS: 500.0000 mg | ORAL_TABLET | Freq: Four times a day (QID) | ORAL | 0 refills | Status: AC | PRN

## 2024-06-06 MED ORDER — LIDOCAINE 5 % EX PTCH
1.0000 | MEDICATED_PATCH | CUTANEOUS | Status: DC
  Filled 2024-06-06: qty 1

## 2024-06-06 NOTE — Discharge Instructions (Addendum)
 It was a pleasure taking care of you today.  As discussed, your low back x-ray showed some arthritis.  I have included low back exercises.  Perform daily.  Low back pain can take up to 6 weeks to fully resolve.  I have refilled your muscle relaxer. I am also sending you home with a few strong pain medication. Take as needed for severe pain. Do not drive on medication because it can cause drowsiness. I have included the number of the neurosurgeon. Call to schedule an appointment if pain does not improve. Return to the ER for new or worsening symptoms.

## 2024-06-06 NOTE — ED Notes (Signed)
 DC paperwork given and verbally understood.

## 2024-06-06 NOTE — ED Triage Notes (Signed)
 Reports ongoing lower back pains, worsening, difficulty ambulating. Seen last Wednesday for same and was given muscle relaxers, taking w/ no relief.

## 2024-06-06 NOTE — ED Provider Notes (Signed)
 [place] EMERGENCY DEPARTMENT AT [facility] Provider Note   CSN: [id number] Arrival date & time: 06/06/24  1437     Patient presents with: Back Pain   Eston Heslin is a 38 y.o. male with no significant past medical history who presents to the ED due to persistent low back pain.  Patient seen twice in the ED for the same complaint.  Notes he has been taking his muscle relaxers and Tylenol  with no relief in symptoms.  Patient denies saddle anesthesia, bowel/bladder incontinence, lower extremity numbness/tingling, lower extremity weakness.  Does admit to some intermittent bilateral feet tingling sensation.  No history of diabetes.  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  Denies chronic low back pain.  Denies abdominal pain, chest pain, and shortness of breath.  No fever or chills.  No IV drug use.  Denies history of cancer.  History obtained from patient and past medical records. No interpreter used during encounter.       Prior to Admission medications   Medication Sig Start Date End Date Taking? Authorizing Provider  HYDROcodone -acetaminophen  (NORCO/VICODIN) 5-325 MG tablet Take 1 tablet by mouth every 6 (six) hours as needed. 06/06/24  Yes Tra Wilemon, Aleck BROCKS, PA-C  methocarbamol  (ROBAXIN ) 500 MG tablet Take 1 tablet (500 mg total) by mouth every 6 (six) hours as needed for muscle spasms. 06/06/24  Yes Beatty, Celeste A, PA-C  naproxen  (NAPROSYN ) 500 MG tablet Take 1 tablet (500 mg total) by mouth 2 (two) times daily. 06/06/24  Yes Jeneane Pieczynski, Aleck BROCKS, PA-C  azithromycin  (ZITHROMAX ) 250 MG tablet Take 1 tablet (250 mg total) by mouth daily. Take first 2 tablets together, then 1 every day until finished. 12/13/23   Curatolo, Adam, DO  cyclobenzaprine  (FLEXERIL ) 10 MG tablet Take 10 mg by mouth 2 (two) times daily as needed for muscle spasms.    [provider]  HYDROcodone -acetaminophen   (NORCO/VICODIN) 5-325 MG tablet Take 1-2 tablets by mouth every 4 (four) hours as needed. 11/30/23   Lenor Hollering, MD  ibuprofen  (ADVIL ) 600 MG tablet Take 1 tablet (600 mg total) by mouth every 6 (six) hours as needed. 08/23/23   Cleotilde Rogue, MD  lidocaine  (LIDODERM ) 5 % Place 1-2 patches onto the skin daily. Remove & Discard patch within 12 hours or as directed by MD 06/03/24   Veta Palma, PA-C  methylPREDNISolone  (MEDROL  DOSEPAK) 4 MG TBPK tablet Follow package insert 12/13/23   Curatolo, Adam, DO  predniSONE  (DELTASONE ) 10 MG tablet Use as directed per doctors orders for the next 12 days. 09/01/23   Jacobs, Bret C, DO    Allergies: Patient has no known allergies.    Review of Systems  Constitutional:  Negative for fever.  Respiratory:  Negative for shortness of breath.   Cardiovascular:  Negative for chest pain.  Gastrointestinal:  Negative for abdominal pain.  Musculoskeletal:  Positive for back pain.    Updated Vital Signs BP 139/84 (BP Location: Left Arm)   Pulse 88   Temp 98.2 F (36.8 C) (Oral)   Resp 18   SpO2 100%   Physical Exam Vitals and nursing note reviewed.  Constitutional:      General: He is not in acute distress.    Appearance: He is not ill-appearing.  HENT:     Head: Normocephalic.  Eyes:     Pupils: Pupils are equal, round, and reactive to light.  Cardiovascular:     Rate  and Rhythm: Normal rate and regular rhythm.     Pulses: Normal pulses.     Heart sounds: Normal heart sounds. No murmur heard.    No friction rub. No gallop.  Pulmonary:     Effort: Pulmonary effort is normal.     Breath sounds: Normal breath sounds.  Abdominal:     General: Abdomen is flat. There is no distension.     Palpations: Abdomen is soft.     Tenderness: There is no abdominal tenderness. There is no guarding or rebound.  Musculoskeletal:        General: Normal range of motion.     Cervical back: Neck supple.     Comments: No thoracic or lumbar midline tenderness.   Does have reproducible right-sided lumbar paraspinal tenderness.  5/5 strength of bilateral lower extremities.  Patient able to ambulate without difficulty.  Skin:    General: Skin is warm and dry.  Neurological:     General: No focal deficit present.     Mental Status: He is alert.  Psychiatric:        Mood and Affect: Mood normal.        Behavior: Behavior normal.     (all labs ordered are listed, but only abnormal results are displayed) Labs Reviewed - No data to display  EKG: None  Radiology: DG Lumbar Spine Complete Result Date: 06/06/2024 CLINICAL DATA:  Lower back pain radiating into the right leg EXAM: LUMBAR SPINE - COMPLETE 5 VIEW COMPARISON:  None Available. FINDINGS: There is no evidence of lumbar spine fracture. Alignment is normal. Mild intervertebral disc space narrowing at L5-S1 with facet arthropathy spanning L4-S1. 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 Electronically Signed   By: Limin  Xu M.D.   On: 06/06/2024 17:09     Procedures   Medications Ordered in the ED  lidocaine  (LIDODERM ) 5 % 1 patch (1 patch Transdermal Patient Refused/Not Given 06/06/24 1640)  ketorolac  (TORADOL ) 15 MG/ML injection 15 mg (15 mg Intramuscular Given 06/06/24 1636)                                    Medical Decision Making Amount and/or Complexity of Data Reviewed Independent Historian: friend Radiology: ordered and independent interpretation performed. Decision-making details documented in ED Course.  Risk Prescription drug management.   This patient presents to the ED for concern of low back pain, this involves an extensive number of treatment options, and is a complaint that carries with it a high risk of complications and morbidity.  The differential diagnosis includes MSK, bony fracture, aortic dissection, cauda equina, etc  38 year old male presents to the ED due to persistent low back pain x 4 days.  Seen in the ED twice for the same  complaint.  No direct injury.  Denies history of chronic back pain.  Denies saddle anesthesia, bowel/bladder incontinence, lower extremity numbness, lower extremity weakness. Denies fever and IV drug use.  Upon arrival, vitals all within normal limits.  Patient well-appearing on exam.  No thoracic or lumbar midline tenderness.  Does have reproducible right lumbar paraspinal tenderness.  Patient able to ambulate in the ED without difficulty.  Bilateral lower extremities neurovascularly intact with soft compartments.  Low suspicion for cauda equina or central cord compression.  Denies chest pain, abdominal pain, shortness of breath.  Low suspicion for aortic dissection.  No urinary symptoms to suggest pyelonephritis.  This is patient's  third time in the ED for the same complaint.  Has not had any imaging.  X-ray ordered however, my suspicion for bony fracture is low given no direct injury, so will hold off on CT imaging.  IM Toradol  given.  Lidocaine  patch placed.  X-ray personally reviewed and interpreted which demonstrates some degenerative changes.  No bony fractures.  Patient able to ambulate in the ED without difficulty.  Admits to some improvement after pain medication here in the ED.  Will discharge patient with symptomatic treatment.  Low back exercises given to patient at discharge.  Neurosurgery number given to patient.  Advised to call to schedule an appointment for further evaluation.  No infectious symptoms to suggest infectious etiology.  Low suspicion for cauda equina or central cord compression.  Patient stable for discharge. Strict ED precautions discussed with patient. Patient states understanding and agrees to plan. Patient discharged home in no acute distress and stable vitals  No PCP No medical conditions    Final diagnoses:  Acute bilateral low back pain without sciatica    ED Discharge Orders          Ordered    methocarbamol  (ROBAXIN ) 500 MG tablet  Every 6 hours PRN         06/06/24 1710    HYDROcodone -acetaminophen  (NORCO/VICODIN) 5-325 MG tablet  Every 6 hours PRN        06/06/24 1730    naproxen  (NAPROSYN ) 500 MG tablet  2 times daily        06/06/24 1730               Tatianna Ibbotson C, PA-C 06/06/24 1741    Dasie Faden, MD 06/08/24 [phone]

## 2024-08-11 ENCOUNTER — Encounter: Payer: Self-pay | Admitting: Gastroenterology
